# Patient Record
Sex: Male | Born: 1935
Health system: Southern US, Community
[De-identification: ages and names within clinical notes are randomized; demographics above are authoritative.]

## PROBLEM LIST (undated history)

## (undated) DIAGNOSIS — Z789 Other specified health status: Secondary | ICD-10-CM

## (undated) DIAGNOSIS — I10 Essential (primary) hypertension: Secondary | ICD-10-CM

## (undated) HISTORY — PX: OTHER SURGICAL HISTORY: SHX169

## (undated) HISTORY — PX: EYE SURGERY: SHX253

---

## 2017-10-12 ENCOUNTER — Other Ambulatory Visit (INDEPENDENT_AMBULATORY_CARE_PROVIDER_SITE_OTHER): Payer: Self-pay | Admitting: Vascular Surgery

## 2017-10-12 ENCOUNTER — Ambulatory Visit (INDEPENDENT_AMBULATORY_CARE_PROVIDER_SITE_OTHER): Payer: Self-pay | Admitting: Vascular Surgery

## 2017-10-12 ENCOUNTER — Encounter (INDEPENDENT_AMBULATORY_CARE_PROVIDER_SITE_OTHER): Payer: Self-pay

## 2017-10-12 DIAGNOSIS — L97521 Non-pressure chronic ulcer of other part of left foot limited to breakdown of skin: Secondary | ICD-10-CM

## 2017-11-18 ENCOUNTER — Ambulatory Visit (INDEPENDENT_AMBULATORY_CARE_PROVIDER_SITE_OTHER): Payer: Self-pay | Admitting: Vascular Surgery

## 2017-11-18 ENCOUNTER — Encounter (INDEPENDENT_AMBULATORY_CARE_PROVIDER_SITE_OTHER): Payer: Self-pay

## 2020-02-03 DIAGNOSIS — L97511 Non-pressure chronic ulcer of other part of right foot limited to breakdown of skin: Secondary | ICD-10-CM | POA: Diagnosis not present

## 2020-02-03 DIAGNOSIS — B351 Tinea unguium: Secondary | ICD-10-CM | POA: Diagnosis not present

## 2020-02-03 DIAGNOSIS — M79675 Pain in left toe(s): Secondary | ICD-10-CM | POA: Diagnosis not present

## 2020-02-03 DIAGNOSIS — M79674 Pain in right toe(s): Secondary | ICD-10-CM | POA: Diagnosis not present

## 2020-04-13 DIAGNOSIS — L97512 Non-pressure chronic ulcer of other part of right foot with fat layer exposed: Secondary | ICD-10-CM | POA: Diagnosis not present

## 2020-04-13 DIAGNOSIS — L97521 Non-pressure chronic ulcer of other part of left foot limited to breakdown of skin: Secondary | ICD-10-CM | POA: Diagnosis not present

## 2020-05-16 ENCOUNTER — Emergency Department: Payer: Medicare Other

## 2020-05-16 DIAGNOSIS — M25062 Hemarthrosis, left knee: Secondary | ICD-10-CM | POA: Diagnosis present

## 2020-05-16 DIAGNOSIS — R748 Abnormal levels of other serum enzymes: Secondary | ICD-10-CM | POA: Diagnosis not present

## 2020-05-16 DIAGNOSIS — T675XXA Heat exhaustion, unspecified, initial encounter: Secondary | ICD-10-CM | POA: Diagnosis not present

## 2020-05-16 DIAGNOSIS — S0083XA Contusion of other part of head, initial encounter: Secondary | ICD-10-CM | POA: Diagnosis not present

## 2020-05-16 DIAGNOSIS — T671XXA Heat syncope, initial encounter: Secondary | ICD-10-CM | POA: Diagnosis not present

## 2020-05-16 DIAGNOSIS — M6282 Rhabdomyolysis: Secondary | ICD-10-CM | POA: Diagnosis present

## 2020-05-16 DIAGNOSIS — W19XXXA Unspecified fall, initial encounter: Secondary | ICD-10-CM | POA: Diagnosis not present

## 2020-05-16 DIAGNOSIS — R9082 White matter disease, unspecified: Secondary | ICD-10-CM | POA: Diagnosis not present

## 2020-05-16 DIAGNOSIS — I248 Other forms of acute ischemic heart disease: Secondary | ICD-10-CM | POA: Diagnosis present

## 2020-05-16 DIAGNOSIS — R9431 Abnormal electrocardiogram [ECG] [EKG]: Secondary | ICD-10-CM | POA: Diagnosis not present

## 2020-05-16 DIAGNOSIS — Y9301 Activity, walking, marching and hiking: Secondary | ICD-10-CM | POA: Diagnosis present

## 2020-05-16 DIAGNOSIS — S51011A Laceration without foreign body of right elbow, initial encounter: Secondary | ICD-10-CM | POA: Diagnosis not present

## 2020-05-16 DIAGNOSIS — M25461 Effusion, right knee: Secondary | ICD-10-CM | POA: Diagnosis not present

## 2020-05-16 DIAGNOSIS — M170 Bilateral primary osteoarthritis of knee: Secondary | ICD-10-CM | POA: Diagnosis not present

## 2020-05-16 DIAGNOSIS — F17200 Nicotine dependence, unspecified, uncomplicated: Secondary | ICD-10-CM | POA: Diagnosis present

## 2020-05-16 DIAGNOSIS — R778 Other specified abnormalities of plasma proteins: Secondary | ICD-10-CM | POA: Diagnosis not present

## 2020-05-16 DIAGNOSIS — X30XXXA Exposure to excessive natural heat, initial encounter: Secondary | ICD-10-CM

## 2020-05-16 DIAGNOSIS — W1839XA Other fall on same level, initial encounter: Secondary | ICD-10-CM | POA: Diagnosis present

## 2020-05-16 DIAGNOSIS — D72829 Elevated white blood cell count, unspecified: Secondary | ICD-10-CM | POA: Diagnosis present

## 2020-05-16 DIAGNOSIS — S0003XA Contusion of scalp, initial encounter: Secondary | ICD-10-CM | POA: Diagnosis not present

## 2020-05-16 DIAGNOSIS — Z20822 Contact with and (suspected) exposure to covid-19: Secondary | ICD-10-CM | POA: Diagnosis not present

## 2020-05-16 DIAGNOSIS — G600 Hereditary motor and sensory neuropathy: Secondary | ICD-10-CM | POA: Diagnosis present

## 2020-05-16 DIAGNOSIS — Y92007 Garden or yard of unspecified non-institutional (private) residence as the place of occurrence of the external cause: Secondary | ICD-10-CM

## 2020-05-16 DIAGNOSIS — R55 Syncope and collapse: Secondary | ICD-10-CM | POA: Diagnosis not present

## 2020-05-16 DIAGNOSIS — R531 Weakness: Secondary | ICD-10-CM | POA: Diagnosis not present

## 2020-05-16 DIAGNOSIS — T796XXA Traumatic ischemia of muscle, initial encounter: Secondary | ICD-10-CM | POA: Diagnosis not present

## 2020-05-16 DIAGNOSIS — E860 Dehydration: Secondary | ICD-10-CM | POA: Diagnosis not present

## 2020-05-16 DIAGNOSIS — G9389 Other specified disorders of brain: Secondary | ICD-10-CM | POA: Diagnosis not present

## 2020-05-16 DIAGNOSIS — S199XXA Unspecified injury of neck, initial encounter: Secondary | ICD-10-CM | POA: Diagnosis not present

## 2020-05-16 DIAGNOSIS — M25462 Effusion, left knee: Secondary | ICD-10-CM | POA: Diagnosis not present

## 2020-05-16 DIAGNOSIS — Y92009 Unspecified place in unspecified non-institutional (private) residence as the place of occurrence of the external cause: Secondary | ICD-10-CM | POA: Diagnosis not present

## 2020-05-16 DIAGNOSIS — N179 Acute kidney failure, unspecified: Secondary | ICD-10-CM | POA: Diagnosis not present

## 2020-05-16 DIAGNOSIS — Z743 Need for continuous supervision: Secondary | ICD-10-CM | POA: Diagnosis not present

## 2020-05-16 DIAGNOSIS — R404 Transient alteration of awareness: Secondary | ICD-10-CM | POA: Diagnosis not present

## 2020-05-16 DIAGNOSIS — S0990XA Unspecified injury of head, initial encounter: Secondary | ICD-10-CM | POA: Diagnosis not present

## 2020-05-16 LAB — CBC
HCT: 37.8 % — ABNORMAL LOW (ref 39.0–52.0)
Hemoglobin: 12.7 g/dL — ABNORMAL LOW (ref 13.0–17.0)
MCH: 32.6 pg (ref 26.0–34.0)
MCHC: 33.6 g/dL (ref 30.0–36.0)
MCV: 96.9 fL (ref 80.0–100.0)
Platelets: 190 10*3/uL (ref 150–400)
RBC: 3.9 MIL/uL — ABNORMAL LOW (ref 4.22–5.81)
RDW: 14 % (ref 11.5–15.5)
WBC: 15.3 10*3/uL — ABNORMAL HIGH (ref 4.0–10.5)
nRBC: 0 % (ref 0.0–0.2)

## 2020-05-16 LAB — URINALYSIS, COMPLETE (UACMP) WITH MICROSCOPIC
Bacteria, UA: NONE SEEN
Bilirubin Urine: NEGATIVE
Glucose, UA: NEGATIVE mg/dL
Ketones, ur: NEGATIVE mg/dL
Leukocytes,Ua: NEGATIVE
Nitrite: NEGATIVE
Protein, ur: NEGATIVE mg/dL
Specific Gravity, Urine: 1.015 (ref 1.005–1.030)
pH: 5 (ref 5.0–8.0)

## 2020-05-16 LAB — BASIC METABOLIC PANEL
Anion gap: 11 (ref 5–15)
BUN: 26 mg/dL — ABNORMAL HIGH (ref 8–23)
CO2: 21 mmol/L — ABNORMAL LOW (ref 22–32)
Calcium: 9.2 mg/dL (ref 8.9–10.3)
Chloride: 105 mmol/L (ref 98–111)
Creatinine, Ser: 1.44 mg/dL — ABNORMAL HIGH (ref 0.61–1.24)
GFR calc Af Amer: 51 mL/min — ABNORMAL LOW (ref >60)
GFR calc non Af Amer: 44 mL/min — ABNORMAL LOW (ref >60)
Glucose, Bld: 103 mg/dL — ABNORMAL HIGH (ref 70–99)
Potassium: 4.8 mmol/L (ref 3.5–5.1)
Sodium: 137 mmol/L (ref 135–145)

## 2020-05-16 LAB — TROPONIN I (HIGH SENSITIVITY): Troponin I (High Sensitivity): 67 ng/L — ABNORMAL HIGH (ref <18)

## 2020-05-16 NOTE — ED Triage Notes (Signed)
Pt reports he lost control of bilateral legs today while mowing and then again under car port and fell, hitting head on concrete. Pt has bruising and swelling to the right forehead. No bleeding. Pt is A&O x4. LOC when pt fell on concrete. Per witness pt fell asleep and then fell. Skin abrasion to the right forearm .

## 2020-05-16 NOTE — ED Triage Notes (Signed)
First nurse note-pt brought in via ems from home .  Pt fell after mowing the lawn today.  Glucose 113.  bp 118/70per ems.  Pt alert.  No chest pain or sob. Skin tear to right arm.

## 2020-05-17 ENCOUNTER — Emergency Department: Payer: Medicare Other

## 2020-05-17 ENCOUNTER — Other Ambulatory Visit: Payer: Self-pay

## 2020-05-17 ENCOUNTER — Inpatient Hospital Stay
Admission: EM | Admit: 2020-05-17 | Discharge: 2020-05-21 | DRG: 641 | Disposition: A | Discharge status: Skilled Nursing Facility | Payer: Medicare Other | Attending: Internal Medicine | Admitting: Internal Medicine

## 2020-05-17 ENCOUNTER — Observation Stay: Payer: Medicare Other

## 2020-05-17 ENCOUNTER — Encounter: Payer: Self-pay | Admitting: Emergency Medicine

## 2020-05-17 DIAGNOSIS — R55 Syncope and collapse: Secondary | ICD-10-CM | POA: Diagnosis not present

## 2020-05-17 DIAGNOSIS — R531 Weakness: Secondary | ICD-10-CM

## 2020-05-17 DIAGNOSIS — N179 Acute kidney failure, unspecified: Secondary | ICD-10-CM | POA: Diagnosis present

## 2020-05-17 DIAGNOSIS — Y92009 Unspecified place in unspecified non-institutional (private) residence as the place of occurrence of the external cause: Secondary | ICD-10-CM

## 2020-05-17 DIAGNOSIS — D72829 Elevated white blood cell count, unspecified: Secondary | ICD-10-CM | POA: Diagnosis present

## 2020-05-17 DIAGNOSIS — R9431 Abnormal electrocardiogram [ECG] [EKG]: Secondary | ICD-10-CM | POA: Insufficient documentation

## 2020-05-17 DIAGNOSIS — E860 Dehydration: Secondary | ICD-10-CM

## 2020-05-17 DIAGNOSIS — G9389 Other specified disorders of brain: Secondary | ICD-10-CM | POA: Diagnosis not present

## 2020-05-17 DIAGNOSIS — M25462 Effusion, left knee: Secondary | ICD-10-CM

## 2020-05-17 DIAGNOSIS — S51011A Laceration without foreign body of right elbow, initial encounter: Secondary | ICD-10-CM

## 2020-05-17 DIAGNOSIS — M6282 Rhabdomyolysis: Secondary | ICD-10-CM | POA: Diagnosis present

## 2020-05-17 DIAGNOSIS — Z720 Tobacco use: Secondary | ICD-10-CM | POA: Diagnosis present

## 2020-05-17 DIAGNOSIS — R748 Abnormal levels of other serum enzymes: Secondary | ICD-10-CM | POA: Diagnosis present

## 2020-05-17 DIAGNOSIS — R7989 Other specified abnormal findings of blood chemistry: Secondary | ICD-10-CM | POA: Diagnosis present

## 2020-05-17 DIAGNOSIS — R778 Other specified abnormalities of plasma proteins: Secondary | ICD-10-CM | POA: Diagnosis present

## 2020-05-17 DIAGNOSIS — R9082 White matter disease, unspecified: Secondary | ICD-10-CM | POA: Diagnosis not present

## 2020-05-17 DIAGNOSIS — W19XXXA Unspecified fall, initial encounter: Secondary | ICD-10-CM

## 2020-05-17 DIAGNOSIS — S0990XA Unspecified injury of head, initial encounter: Secondary | ICD-10-CM | POA: Diagnosis not present

## 2020-05-17 LAB — BASIC METABOLIC PANEL
Anion gap: 13 (ref 5–15)
BUN: 26 mg/dL — ABNORMAL HIGH (ref 8–23)
CO2: 22 mmol/L (ref 22–32)
Calcium: 9.1 mg/dL (ref 8.9–10.3)
Chloride: 102 mmol/L (ref 98–111)
Creatinine, Ser: 1.3 mg/dL — ABNORMAL HIGH (ref 0.61–1.24)
GFR calc Af Amer: 57 mL/min — ABNORMAL LOW (ref >60)
GFR calc non Af Amer: 49 mL/min — ABNORMAL LOW (ref >60)
Glucose, Bld: 110 mg/dL — ABNORMAL HIGH (ref 70–99)
Potassium: 4.2 mmol/L (ref 3.5–5.1)
Sodium: 137 mmol/L (ref 135–145)

## 2020-05-17 LAB — CK
Total CK: 392 U/L (ref 49–397)
Total CK: 436 U/L — ABNORMAL HIGH (ref 49–397)

## 2020-05-17 LAB — LIPID PANEL
Cholesterol: 192 mg/dL (ref 0–200)
HDL: 35 mg/dL — ABNORMAL LOW (ref >40)
LDL Cholesterol: 121 mg/dL — ABNORMAL HIGH (ref 0–99)
Total CHOL/HDL Ratio: 5.5 RATIO
Triglycerides: 180 mg/dL — ABNORMAL HIGH (ref <150)
VLDL: 36 mg/dL (ref 0–40)

## 2020-05-17 LAB — TROPONIN I (HIGH SENSITIVITY)
Troponin I (High Sensitivity): 190 ng/L (ref <18)
Troponin I (High Sensitivity): 168 ng/L (ref <18)
Troponin I (High Sensitivity): 132 ng/L (ref <18)
Troponin I (High Sensitivity): 92 ng/L — ABNORMAL HIGH (ref <18)

## 2020-05-17 LAB — SARS CORONAVIRUS 2 BY RT PCR (HOSPITAL ORDER, PERFORMED IN ~~LOC~~ HOSPITAL LAB): SARS Coronavirus 2: NEGATIVE

## 2020-05-17 MED ORDER — ASPIRIN 81 MG PO CHEW
81.0000 mg | CHEWABLE_TABLET | Freq: Every day | ORAL | Status: DC
  Administered 2020-05-18 – 2020-05-21 (×4): 81 mg via ORAL
  Filled 2020-05-17 (×4): qty 1

## 2020-05-17 MED ORDER — SODIUM CHLORIDE 0.9 % IV SOLN: INTRAVENOUS | Status: DC

## 2020-05-17 MED ORDER — ACETAMINOPHEN 325 MG PO TABS
650.0000 mg | ORAL_TABLET | Freq: Four times a day (QID) | ORAL | Status: DC | PRN
  Administered 2020-05-20 (×2): 650 mg via ORAL
  Filled 2020-05-17 (×2): qty 2

## 2020-05-17 MED ORDER — ACETAMINOPHEN 500 MG PO TABS
1000.0000 mg | ORAL_TABLET | ORAL | Status: AC
  Administered 2020-05-17: 1000 mg via ORAL
  Filled 2020-05-17: qty 2

## 2020-05-17 MED ORDER — SODIUM CHLORIDE 0.9 % IV BOLUS
1000.0000 mL | Freq: Once | INTRAVENOUS | Status: AC
  Administered 2020-05-17: 1000 mL via INTRAVENOUS

## 2020-05-17 MED ORDER — ASPIRIN 81 MG PO CHEW
324.0000 mg | CHEWABLE_TABLET | Freq: Once | ORAL | Status: AC
  Administered 2020-05-17: 324 mg via ORAL
  Filled 2020-05-17: qty 4

## 2020-05-17 MED ORDER — NICOTINE 21 MG/24HR TD PT24
21.0000 mg | MEDICATED_PATCH | Freq: Every day | TRANSDERMAL | Status: DC
  Administered 2020-05-17 – 2020-05-21 (×5): 21 mg via TRANSDERMAL
  Filled 2020-05-17 (×5): qty 1

## 2020-05-17 MED ORDER — ONDANSETRON HCL 4 MG/2ML IJ SOLN: 4.0000 mg | Freq: Three times a day (TID) | INTRAMUSCULAR | Status: DC | PRN

## 2020-05-17 MED ORDER — MUSCLE RUB 10-15 % EX CREA
TOPICAL_CREAM | CUTANEOUS | Status: DC | PRN
  Filled 2020-05-17 (×2): qty 85

## 2020-05-17 NOTE — ED Notes (Signed)
Pt's right arm cleaned up and non stick gauze and AED pad applied then wrapped with gauze.

## 2020-05-17 NOTE — ED Notes (Signed)
Pt given breakfast tray

## 2020-05-17 NOTE — ED Provider Notes (Signed)
[facility] Emergency Department Provider Note   ____________________________________________   First MD Initiated Contact with Patient 05/17/20 [id number]     (approximate)  I have reviewed the triage vital signs and the nursing notes.   HISTORY  Chief Complaint Weakness and Fall    HPI Keith Morris is a 86 y.o. male history of Charcot-Marie-Tooth  Patient reports that yesterday after mowing the lawn he was walking into his home when he fell in his carport.  He fell onto both knees, and also scraped his right elbow.  Denies that the elbow feels injured other than some skin scrapes.  Both knees are sore, but he reports they chronically hurt as well.  He has a history of Charcot Hilda Lias as well as arthritis  No neck pain.  No fevers or chills or recent illness.  No chest pain or trouble breathing.  Denies abdominal pain.  Did report he felt fatigued, felt like his legs sort of got weak as he was walking and buckled underneath him  Currently reports aching in both knees.  No new numbness or weakness, but does have some slight muscle weakness due to his Charcot Hilda Lias   Has not had anything to eat since 1230 yesterday.  Nothing to drink since then.  Thought he might be dehydrated because of the heat when he fell  Reports the only medication he takes is he takes a baby aspirin about once every couple of days  No past medical history on file.  Patient Active Problem List   Diagnosis Date Noted   Fall at home, initial encounter 05/17/2020   AKI (acute kidney injury) (HCC) 05/17/2020   Elevated troponin 05/17/2020   Elevated CK 05/17/2020   Leukocytosis 05/17/2020   Abnormal EKG       Prior to Admission medications   Not on File    Allergies Patient has no known allergies.  No family history on file.  Social History Social History                   Patient does not use drugs, does not use alcohol.  Patient does smoke cigarettes  Review of  Systems Constitutional: No fever/chills Eyes: No visual changes. ENT: No sore throat.  No neck pain Cardiovascular: Denies chest pain. Respiratory: Denies shortness of breath. Gastrointestinal: No abdominal pain.   Genitourinary: Negative for dysuria. Musculoskeletal: Negative for back pain.  Some abrasion over his right elbow, chronic pain and achiness in both knees slightly more sore after falling.  Would like something like BenGay and usually also takes aspirin for pain relief at home Skin: Negative for rash. Neurological: Negative for headaches, areas of focal weakness or numbness.    ____________________________________________   PHYSICAL EXAM:  VITAL SIGNS: ED Triage Vitals  Enc Vitals Group     BP 05/16/20 2019 (!) 145/99     Pulse Rate 05/16/20 2019 81     Resp 05/16/20 2019 18     Temp 05/16/20 2019 98.4 F (36.9 C)     Temp Source 05/16/20 2019 Oral     SpO2 05/16/20 2019 98 %     Weight 05/17/20 0504 168 lb (76.2 kg)     Height 05/17/20 0504 6' (1.829 m)     Head Circumference --      Peak Flow --      Pain Score 05/17/20 0700 5     Pain Loc --      Pain Edu? --  Excl. in GC? --     Constitutional: Alert and oriented. Well appearing and in no acute distress.  He appears mildly fatigued. Eyes: Conjunctivae are normal. Head: Atraumatic. Nose: No congestion/rhinnorhea. Mouth/Throat: Mucous membranes are very dry. Neck: No stridor.  Cardiovascular: Normal rate, regular rhythm. Grossly normal heart sounds.  Good peripheral circulation. Respiratory: Normal respiratory effort.  No retractions. Lungs CTAB. Gastrointestinal: Soft and nontender. No distention. Musculoskeletal: No lower extremity tenderness nor edema.  Arthritic-like changes over both knees, slight weakness in the lower extremities bilateral.  Skin tear noted over the right elbow.  Ranges it well, and does not wish for x-ray.  No focal tenderness in that area to suggest fracture. Neurologic:   Normal speech and language. No gross focal neurologic deficits are appreciated.  Skin:  Skin is warm, dry and intact. No rash noted. Psychiatric: Mood and affect are normal. Speech and behavior are normal.  Patient is extremely pleasant ____________________________________________   LABS (all labs ordered are listed, but only abnormal results are displayed)  Labs Reviewed  BASIC METABOLIC PANEL - Abnormal; Notable for the following components:      Result Value   CO2 21 (*)    Glucose, Bld 103 (*)    BUN 26 (*)    Creatinine, Ser 1.44 (*)    GFR calc non Af Amer 44 (*)    GFR calc Af Amer 51 (*)    All other components within normal limits  CBC - Abnormal; Notable for the following components:   WBC 15.3 (*)    RBC 3.90 (*)    Hemoglobin 12.7 (*)    HCT 37.8 (*)    All other components within normal limits  URINALYSIS, COMPLETE (UACMP) WITH MICROSCOPIC - Abnormal; Notable for the following components:   Color, Urine YELLOW (*)    APPearance CLEAR (*)    Hgb urine dipstick SMALL (*)    All other components within normal limits  TROPONIN I (HIGH SENSITIVITY) - Abnormal; Notable for the following components:   Troponin I (High Sensitivity) 67 (*)    All other components within normal limits  TROPONIN I (HIGH SENSITIVITY) - Abnormal; Notable for the following components:   Troponin I (High Sensitivity) 190 (*)    All other components within normal limits  SARS CORONAVIRUS 2 BY RT PCR (HOSPITAL ORDER, PERFORMED IN  HOSPITAL LAB)  CK  BASIC METABOLIC PANEL  CK  LIPID PANEL  TROPONIN I (HIGH SENSITIVITY)   ____________________________________________  EKG  Reviewed interpreted by me at 7 AM Heart rate 80 QRS 90 QTc 460 Normal sinus rhythm, mild ST abnormality noted in inferior and lateral also slight anterior.  Of note patient denies having any chest pain. ____________________________________________  RADIOLOGY  CT Head Wo Contrast  Result Date:  05/16/2020 CLINICAL DATA:  Head injury, fall, facial trauma EXAM: CT HEAD WITHOUT CONTRAST CT CERVICAL SPINE WITHOUT CONTRAST TECHNIQUE: Multidetector CT imaging of the head and cervical spine was performed following the standard protocol without intravenous contrast. Multiplanar CT image reconstructions of the cervical spine were also generated. COMPARISON:  None. FINDINGS: CT HEAD FINDINGS Brain: Normal anatomic configuration. Mild to moderate parenchymal volume loss is commensurate with the patient's age. Mild periventricular white matter changes are present likely reflecting the sequela of small vessel ischemia. No abnormal intra or extra-axial mass lesion or fluid collection. No abnormal mass effect or midline shift. No evidence of acute intracranial hemorrhage or infarct. Ventricular size is normal. Cerebellum unremarkable. Vascular: Moderate atherosclerotic calcification is  seen within the carotid siphons bilaterally. No asymmetric hyperdense vasculature noted at the skull base. Skull: Intact Sinuses/Orbits: Paranasal sinuses are clear. Orbits are unremarkable. Other: Mastoid air cells and middle ear cavities are clear. Small scalp hematoma seen within the right supraorbital region. CT CERVICAL SPINE FINDINGS Alignment: Normal cervical lordosis. No listhesis of the cervical spine. Skull base and vertebrae: The craniocervical junction is unremarkable. The atlantodental interval is normal. Vertebral body height has been preserved. No acute fracture of the cervical spine. No lytic or blastic bone lesion. Soft tissues and spinal canal: Moderate atherosclerotic calcifications seen within the carotid bifurcations bilaterally. No prevertebral soft tissue swelling or paraspinal fluid collections identified. No visible canal hematoma. Disc levels: There is intervertebral disc space narrowing and endplate remodeling of C3-T2 in keeping with changes of diffuse moderate to severe degenerative disc disease. There is  degenerative ankylosis of the vertebral bodies of C4 and C5. Review of the axial images demonstrates: C1-2: Moderate atlantodental degenerative arthritis. C2-3: Severe left facet arthrosis results in moderate left neural foraminal narrowing. No significant canal stenosis. C3-4: Severe bilateral uncovertebral arthrosis, mild left facet arthrosis, and severe right facet arthrosis results in severe right neural foraminal narrowing and moderate left neural foraminal narrowing. Mild effacement of the anterior canal space. No significant canal stenosis. C4-5: Moderate bilateral uncovertebral arthrosis and mild right and moderate left facet arthrosis results in severe left and moderate to severe right neural foraminal narrowing. Effacement of the a anterior canal space without significant canal stenosis. C5-6: Mild osteophytic ridging and mild bilateral facet arthrosis results in mild left and mild-to-moderate right neural foraminal narrowing. No significant canal stenosis. C6-7: Moderate bilateral uncovertebral arthrosis and mild facet arthrosis results in moderate bilateral neural foraminal narrowing. C7-T1: Moderate bilateral facet arthrosis. No significant neural foraminal narrowing. No canal stenosis. Upper chest: The visualized lung apices are unremarkable. Other: None significant IMPRESSION: 1. Small right supraorbital scalp hematoma. 2. No acute intracranial abnormality. 3. No acute fracture or subluxation of the cervical spine. 4. Moderate to severe multilevel degenerative disc disease and facet arthrosis of the cervical spine, most prominent at C3-T2. Electronically Signed   By: Helyn Numbers MD   On: 05/16/2020 21:19   CT Cervical Spine Wo Contrast  Result Date: 05/16/2020 CLINICAL DATA:  Head injury, fall, facial trauma EXAM: CT HEAD WITHOUT CONTRAST CT CERVICAL SPINE WITHOUT CONTRAST TECHNIQUE: Multidetector CT imaging of the head and cervical spine was performed following the standard protocol without  intravenous contrast. Multiplanar CT image reconstructions of the cervical spine were also generated. COMPARISON:  None. FINDINGS: CT HEAD FINDINGS Brain: Normal anatomic configuration. Mild to moderate parenchymal volume loss is commensurate with the patient's age. Mild periventricular white matter changes are present likely reflecting the sequela of small vessel ischemia. No abnormal intra or extra-axial mass lesion or fluid collection. No abnormal mass effect or midline shift. No evidence of acute intracranial hemorrhage or infarct. Ventricular size is normal. Cerebellum unremarkable. Vascular: Moderate atherosclerotic calcification is seen within the carotid siphons bilaterally. No asymmetric hyperdense vasculature noted at the skull base. Skull: Intact Sinuses/Orbits: Paranasal sinuses are clear. Orbits are unremarkable. Other: Mastoid air cells and middle ear cavities are clear. Small scalp hematoma seen within the right supraorbital region. CT CERVICAL SPINE FINDINGS Alignment: Normal cervical lordosis. No listhesis of the cervical spine. Skull base and vertebrae: The craniocervical junction is unremarkable. The atlantodental interval is normal. Vertebral body height has been preserved. No acute fracture of the cervical spine. No lytic or blastic bone  lesion. Soft tissues and spinal canal: Moderate atherosclerotic calcifications seen within the carotid bifurcations bilaterally. No prevertebral soft tissue swelling or paraspinal fluid collections identified. No visible canal hematoma. Disc levels: There is intervertebral disc space narrowing and endplate remodeling of C3-T2 in keeping with changes of diffuse moderate to severe degenerative disc disease. There is degenerative ankylosis of the vertebral bodies of C4 and C5. Review of the axial images demonstrates: C1-2: Moderate atlantodental degenerative arthritis. C2-3: Severe left facet arthrosis results in moderate left neural foraminal narrowing. No  significant canal stenosis. C3-4: Severe bilateral uncovertebral arthrosis, mild left facet arthrosis, and severe right facet arthrosis results in severe right neural foraminal narrowing and moderate left neural foraminal narrowing. Mild effacement of the anterior canal space. No significant canal stenosis. C4-5: Moderate bilateral uncovertebral arthrosis and mild right and moderate left facet arthrosis results in severe left and moderate to severe right neural foraminal narrowing. Effacement of the a anterior canal space without significant canal stenosis. C5-6: Mild osteophytic ridging and mild bilateral facet arthrosis results in mild left and mild-to-moderate right neural foraminal narrowing. No significant canal stenosis. C6-7: Moderate bilateral uncovertebral arthrosis and mild facet arthrosis results in moderate bilateral neural foraminal narrowing. C7-T1: Moderate bilateral facet arthrosis. No significant neural foraminal narrowing. No canal stenosis. Upper chest: The visualized lung apices are unremarkable. Other: None significant IMPRESSION: 1. Small right supraorbital scalp hematoma. 2. No acute intracranial abnormality. 3. No acute fracture or subluxation of the cervical spine. 4. Moderate to severe multilevel degenerative disc disease and facet arthrosis of the cervical spine, most prominent at C3-T2. Electronically Signed   By: Helyn Numbers MD   On: 05/16/2020 21:19   DG Knee Complete 4 Views Left  Result Date: 05/17/2020 CLINICAL DATA:  Fall yesterday. EXAM: LEFT KNEE - COMPLETE 4+ VIEW COMPARISON:  Right knee radiographs 05/17/2020 FINDINGS: Advanced tricompartmental degenerative changes are noted in the knee. A moderate to large effusion is present. No discrete fracture is evident. Vascular calcifications are noted. IMPRESSION: 1. Moderate to large effusion. 2. No discrete fracture.  Ligamentous injury is not excluded. 3. Advanced tricompartmental degenerative changes. Electronically Signed    By: Marin Roberts M.D.   On: 05/17/2020 06:44   DG Knee Complete 4 Views Right  Result Date: 05/17/2020 CLINICAL DATA:  Bilateral knee pain.  Fall yesterday. EXAM: RIGHT KNEE - COMPLETE 4+ VIEW COMPARISON:  Left knee radiographs 05/17/2020 FINDINGS: Advanced degenerative changes are present with asymmetric joint space loss laterally. Patellofemoral disease is present. Small effusion is noted. No acute or healing fractures are present. Vascular calcifications are present. IMPRESSION: 1. Small effusion. 2. No acute fractures. 3. Advanced tricompartmental degenerative changes. 4. Atherosclerosis. Electronically Signed   By: Marin Roberts M.D.   On: 05/17/2020 06:41     Imaging studies are reviewed, notable for effusions of the knees bilateral but no acute fractures.  Head CT notable for small right scalp hematoma ____________________________________________   PROCEDURES  Procedure(s) performed: None  Procedures  Critical Care performed: No  ____________________________________________   INITIAL IMPRESSION / ASSESSMENT AND PLAN / ED COURSE  Pertinent labs & imaging results that were available during my care of the patient were reviewed by me and considered in my medical decision making (see chart for details).   Patient presents after fall after mowing the lawn.  Felt weak or dehydrated.  Suspect this likely contributed causing him to feel weak in the knees and fall.  He does however have a slightly abnormal EKG as well as  mildly elevated troponins this morning.  By time I saw him he has been in the waiting area n.p.o. for about 12 hours and appears extremely dry.  Denies history of cardiac disease.  Additionally has mildly elevated CK elevated creatinine, unknown baseline but appears that he does not have any known history of kidney disease.  Leukocytosis.  Denies infectious symptoms  Discussed with the patient, will hydrate him, allow him to eat and drink, provide pain relief  for which he would like BenGay cream as well as aspirin which is his typical treatments.  Both knees appear to have arthritic-like changes and effusions but they do not appear to be acute on my examination, he has severe arthropathy by exam in both knees  Small skin tear to moderate skin tear over the right elbow and forearm bandaged.  We will admit to hospital for further care and management.  Keith Morris was evaluated in Emergency Department on 05/17/2020 for the symptoms described in the history of present illness. He was evaluated in the context of the global COVID-19 pandemic, which necessitated consideration that the patient might be at risk for infection with the SARS-CoV-2 virus that causes COVID-19. Institutional protocols and algorithms that pertain to the evaluation of patients at risk for COVID-19 are in a state of rapid change based on information released by regulatory bodies including the CDC and federal and state organizations. These policies and algorithms were followed during the patient's care in the ED.     Admission discussed with Dr. Clyde LundborgNiu  ____________________________________________   FINAL CLINICAL IMPRESSION(S) / ED DIAGNOSES  Final diagnoses:  Weakness  Abnormal EKG  Elevated troponin  AKI (acute kidney injury) (HCC)  Dehydration  Skin tear of right elbow without complication, initial encounter        Note:  This document was prepared using Dragon voice recognition software and may include unintentional dictation errors       Sharyn CreamerQuale, Arlynn Mcdermid, MD 05/17/20 [phone]

## 2020-05-17 NOTE — ED Notes (Signed)
Pt given coffee at this time 

## 2020-05-17 NOTE — H&P (Signed)
History and Physical    Keith Morris SWF:[id number] DOB: 1932-07-31 DOA: 05/17/2020  Referring MD/NP/PA:   PCP: Patient, No Pcp Per   Patient coming from:  The patient is coming from home.  At baseline, pt is independent for most of ADL.        Chief Complaint: syncope  HPI: Keith Morris is a 86 y.o. male with medical history significant of tobacco abuse, who presents with syncope.  Per her daughter, patient had 2 episodes of syncope.  First episode happened while patient was mowing the lawn at about 2:30 PM yesterday. Pt had another episode of syncope when patient moved to inside of the house.  Patient states that he lost control of both legs.  No seizure activity.  Patient fell and hit his head.  Patient has a skin tear in the right forearm.  He also has hematoma in the right forehead.  Patient does not have unilateral numbness or tingling in extremities. No facial droop or slurred speech.  Denies chest pain, shortness breath, cough, fever or chills.  No nausea vomiting, diarrhea, abdominal pain, symptoms of UTI.   ED Course: pt was found to have WBC 15.3, troponin level 67, 190, 168, 132, negative urinalysis, CK 392, 436, negative Covid PCR, AKI with creatinine 1.44, BUN 26, temperature normal, blood pressure 129/68, heart rate 76, RR 14, oxygen saturation 98% on room air.  Patient is placed on progressive bed for observation.  CT of head and C spin: 1. Small right supraorbital scalp hematoma. 2. No acute intracranial abnormality. 3. No acute fracture or subluxation of the cervical spine. 4. Moderate to severe multilevel degenerative disc disease and facet arthrosis of the cervical spine, most prominent at C3-T2.  X-ray of L knee: 1. Moderate to large effusion. 2. No discrete fracture.  Ligamentous injury is not excluded. 3. Advanced tricompartmental degenerative changes.  X-ray of right knee: 1. Small effusion. 2. No acute fractures. 3. Advanced tricompartmental degenerative  changes. 4. Atherosclerosis.   Review of Systems:   General: no fevers, chills, no body weight gain, has fatigue HEENT: no blurry vision, hearing changes or sore throat Respiratory: no dyspnea, coughing, wheezing CV: no chest pain, no palpitations GI: no nausea, vomiting, abdominal pain, diarrhea, constipation GU: no dysuria, burning on urination, increased urinary frequency, hematuria  Ext: no leg edema Neuro: no unilateral weakness, numbness, or tingling, no vision change or hearing loss. Has fall and syncope Skin: no rash, has skin tear in right arm MSK: No muscle spasm, no deformity, no limitation of range of movement in spin Heme: No easy bruising.  Travel history: No recent long distant travel.  Allergy: No Known Allergies  History reviewed. No pertinent past medical history.  Past Surgical History:  Procedure Laterality Date  . foot surgery bilaterally      Social History:  reports that he has been smoking. He has never used smokeless tobacco. He reports previous alcohol use. He reports that he does not use drugs.  Family History: Reviewed with patient and his daughter, no significant family medical history.   Prior to Admission medications   Not on File    Physical Exam: Vitals:   05/17/20 0820 05/17/20 1034 05/17/20 1247 05/17/20 1715  BP: 129/65 (!) 108/58  (!) 160/87  Pulse: 73 73 73 85  Resp: 15 18 19 16   Temp:    98.3 F (36.8 C)  TempSrc:    Oral  SpO2: 100% 100% 97% 100%  Weight:    73.2 kg  Height:    6' (1.829 m)   General: Not in acute distress HEENT: has hematoma in right forehead.       Eyes: PERRL, EOMI, no scleral icterus.       ENT: No discharge from the ears and nose, no pharynx injection, no tonsillar enlargement.        Neck: No JVD, no bruit, no mass felt. Heme: No neck lymph node enlargement. Cardiac: S1/S2, RRR, No murmurs, No gallops or rubs. Respiratory:  No rales, wheezing, rhonchi or rubs. GI: Soft, nondistended, nontender, no  rebound pain, no organomegaly, BS present. GU: No hematuria Ext: No pitting leg edema bilaterally. 2+DP/PT pulse bilaterally. Musculoskeletal: No joint deformities, No joint redness or warmth, no limitation of ROM in spin. Skin: No rashes. Has skin tear in right forearm. Neuro: Alert, oriented X3, cranial nerves II-XII grossly intact, moves all extremities. Psych: Patient is not psychotic, no suicidal or hemocidal ideation.  Labs on Admission: I have personally reviewed following labs and imaging studies  CBC: Recent Labs  Lab 05/16/20 2020  WBC 15.3*  HGB 12.7*  HCT 37.8*  MCV 96.9  PLT 190   Basic Metabolic Panel: Recent Labs  Lab 05/16/20 2020 05/17/20 0810  NA 137 137  K 4.8 4.2  CL 105 102  CO2 21* 22  GLUCOSE 103* 110*  BUN 26* 26*  CREATININE 1.44* 1.30*  CALCIUM 9.2 9.1   GFR: Estimated Creatinine Clearance: 42.2 mL/min (A) (by C-G formula based on SCr of 1.3 mg/dL (H)). Liver Function Tests: No results for input(s): AST, ALT, ALKPHOS, BILITOT, PROT, ALBUMIN in the last 168 hours. No results for input(s): LIPASE, AMYLASE in the last 168 hours. No results for input(s): AMMONIA in the last 168 hours. Coagulation Profile: No results for input(s): INR, PROTIME in the last 168 hours. Cardiac Enzymes: Recent Labs  Lab 05/17/20 0505 05/17/20 0810  CKTOTAL 392 436*   BNP (last 3 results) No results for input(s): PROBNP in the last 8760 hours. HbA1C: No results for input(s): HGBA1C in the last 72 hours. CBG: No results for input(s): GLUCAP in the last 168 hours. Lipid Profile: Recent Labs    05/17/20 0810  CHOL 192  HDL 35*  LDLCALC 121*  TRIG 180*  CHOLHDL 5.5   Thyroid Function Tests: No results for input(s): TSH, T4TOTAL, FREET4, T3FREE, THYROIDAB in the last 72 hours. Anemia Panel: No results for input(s): VITAMINB12, FOLATE, FERRITIN, TIBC, IRON, RETICCTPCT in the last 72 hours. Urine analysis:    Component Value Date/Time   COLORURINE  YELLOW (A) 05/16/2020 2015   APPEARANCEUR CLEAR (A) 05/16/2020 2015   LABSPEC 1.015 05/16/2020 2015   PHURINE 5.0 05/16/2020 2015   GLUCOSEU NEGATIVE 05/16/2020 2015   HGBUR SMALL (A) 05/16/2020 2015   BILIRUBINUR NEGATIVE 05/16/2020 2015   KETONESUR NEGATIVE 05/16/2020 2015   PROTEINUR NEGATIVE 05/16/2020 2015   NITRITE NEGATIVE 05/16/2020 2015   LEUKOCYTESUR NEGATIVE 05/16/2020 2015   Sepsis Labs: @LABRCNTIP (procalcitonin:4,lacticidven:4) ) Recent Results (from the past 240 hour(s))  SARS Coronavirus 2 by RT PCR (hospital order, performed in [facility] hospital lab) Nasopharyngeal Nasopharyngeal Swab     Status: None   Collection Time: 05/17/20  8:10 AM   Specimen: Nasopharyngeal Swab  Result Value Ref Range Status   SARS Coronavirus 2 NEGATIVE NEGATIVE Final    Comment: (NOTE) SARS-CoV-2 target nucleic acids are NOT DETECTED.  The SARS-CoV-2 RNA is generally detectable in upper and lower respiratory specimens during the acute phase of infection. The lowest concentration  of SARS-CoV-2 viral copies this assay can detect is 250 copies / mL. A negative result does not preclude SARS-CoV-2 infection and should not be used as the sole basis for treatment or other patient management decisions.  A negative result may occur with improper specimen collection / handling, submission of specimen other than nasopharyngeal swab, presence of viral mutation(s) within the areas targeted by this assay, and inadequate number of viral copies (<250 copies / mL). A negative result must be combined with clinical observations, patient history, and epidemiological information.  Fact Sheet for Patients:   BoilerBrush.com.cy  Fact Sheet for Healthcare Providers: https://pope.com/  This test is not yet approved or  cleared by the [place] FDA and has been authorized for detection and/or diagnosis of SARS-CoV-2 by FDA under an Emergency Use  Authorization (EUA).  This EUA will remain in effect (meaning this test can be used) for the duration of the COVID-19 declaration under Section 564(b)(1) of the Act, 21 U.S.C. section 360bbb-3(b)(1), unless the authorization is terminated or revoked sooner.  Performed at [facility] [address] [place], [place] [address]      Radiological Exams on Admission: CT Head Wo Contrast  Result Date: 05/16/2020 CLINICAL DATA:  Head injury, fall, facial trauma EXAM: CT HEAD WITHOUT CONTRAST CT CERVICAL SPINE WITHOUT CONTRAST TECHNIQUE: Multidetector CT imaging of the head and cervical spine was performed following the standard protocol without intravenous contrast. Multiplanar CT image reconstructions of the cervical spine were also generated. COMPARISON:  None. FINDINGS: CT HEAD FINDINGS Brain: Normal anatomic configuration. Mild to moderate parenchymal volume loss is commensurate with the patient's age. Mild periventricular white matter changes are present likely reflecting the sequela of small vessel ischemia. No abnormal intra or extra-axial mass lesion or fluid collection. No abnormal mass effect or midline shift. No evidence of acute intracranial hemorrhage or infarct. Ventricular size is normal. Cerebellum unremarkable. Vascular: Moderate atherosclerotic calcification is seen within the carotid siphons bilaterally. No asymmetric hyperdense vasculature noted at the skull base. Skull: Intact Sinuses/Orbits: Paranasal sinuses are clear. Orbits are unremarkable. Other: Mastoid air cells and middle ear cavities are clear. Small scalp hematoma seen within the right supraorbital region. CT CERVICAL SPINE FINDINGS Alignment: Normal cervical lordosis. No listhesis of the cervical spine. Skull base and vertebrae: The craniocervical junction is unremarkable. The atlantodental interval is normal. Vertebral body height has been preserved. No acute fracture of the cervical spine. No lytic or blastic  bone lesion. Soft tissues and spinal canal: Moderate atherosclerotic calcifications seen within the carotid bifurcations bilaterally. No prevertebral soft tissue swelling or paraspinal fluid collections identified. No visible canal hematoma. Disc levels: There is intervertebral disc space narrowing and endplate remodeling of C3-T2 in keeping with changes of diffuse moderate to severe degenerative disc disease. There is degenerative ankylosis of the vertebral bodies of C4 and C5. Review of the axial images demonstrates: C1-2: Moderate atlantodental degenerative arthritis. C2-3: Severe left facet arthrosis results in moderate left neural foraminal narrowing. No significant canal stenosis. C3-4: Severe bilateral uncovertebral arthrosis, mild left facet arthrosis, and severe right facet arthrosis results in severe right neural foraminal narrowing and moderate left neural foraminal narrowing. Mild effacement of the anterior canal space. No significant canal stenosis. C4-5: Moderate bilateral uncovertebral arthrosis and mild right and moderate left facet arthrosis results in severe left and moderate to severe right neural foraminal narrowing. Effacement of the a anterior canal space without significant canal stenosis. C5-6: Mild osteophytic ridging and mild bilateral facet arthrosis results in mild  left and mild-to-moderate right neural foraminal narrowing. No significant canal stenosis. C6-7: Moderate bilateral uncovertebral arthrosis and mild facet arthrosis results in moderate bilateral neural foraminal narrowing. C7-T1: Moderate bilateral facet arthrosis. No significant neural foraminal narrowing. No canal stenosis. Upper chest: The visualized lung apices are unremarkable. Other: None significant IMPRESSION: 1. Small right supraorbital scalp hematoma. 2. No acute intracranial abnormality. 3. No acute fracture or subluxation of the cervical spine. 4. Moderate to severe multilevel degenerative disc disease and facet  arthrosis of the cervical spine, most prominent at C3-T2. Electronically Signed   By: Helyn NumbersAshesh  Parikh MD   On: 05/16/2020 21:19   CT Cervical Spine Wo Contrast  Result Date: 05/16/2020 CLINICAL DATA:  Head injury, fall, facial trauma EXAM: CT HEAD WITHOUT CONTRAST CT CERVICAL SPINE WITHOUT CONTRAST TECHNIQUE: Multidetector CT imaging of the head and cervical spine was performed following the standard protocol without intravenous contrast. Multiplanar CT image reconstructions of the cervical spine were also generated. COMPARISON:  None. FINDINGS: CT HEAD FINDINGS Brain: Normal anatomic configuration. Mild to moderate parenchymal volume loss is commensurate with the patient's age. Mild periventricular white matter changes are present likely reflecting the sequela of small vessel ischemia. No abnormal intra or extra-axial mass lesion or fluid collection. No abnormal mass effect or midline shift. No evidence of acute intracranial hemorrhage or infarct. Ventricular size is normal. Cerebellum unremarkable. Vascular: Moderate atherosclerotic calcification is seen within the carotid siphons bilaterally. No asymmetric hyperdense vasculature noted at the skull base. Skull: Intact Sinuses/Orbits: Paranasal sinuses are clear. Orbits are unremarkable. Other: Mastoid air cells and middle ear cavities are clear. Small scalp hematoma seen within the right supraorbital region. CT CERVICAL SPINE FINDINGS Alignment: Normal cervical lordosis. No listhesis of the cervical spine. Skull base and vertebrae: The craniocervical junction is unremarkable. The atlantodental interval is normal. Vertebral body height has been preserved. No acute fracture of the cervical spine. No lytic or blastic bone lesion. Soft tissues and spinal canal: Moderate atherosclerotic calcifications seen within the carotid bifurcations bilaterally. No prevertebral soft tissue swelling or paraspinal fluid collections identified. No visible canal hematoma. Disc  levels: There is intervertebral disc space narrowing and endplate remodeling of C3-T2 in keeping with changes of diffuse moderate to severe degenerative disc disease. There is degenerative ankylosis of the vertebral bodies of C4 and C5. Review of the axial images demonstrates: C1-2: Moderate atlantodental degenerative arthritis. C2-3: Severe left facet arthrosis results in moderate left neural foraminal narrowing. No significant canal stenosis. C3-4: Severe bilateral uncovertebral arthrosis, mild left facet arthrosis, and severe right facet arthrosis results in severe right neural foraminal narrowing and moderate left neural foraminal narrowing. Mild effacement of the anterior canal space. No significant canal stenosis. C4-5: Moderate bilateral uncovertebral arthrosis and mild right and moderate left facet arthrosis results in severe left and moderate to severe right neural foraminal narrowing. Effacement of the a anterior canal space without significant canal stenosis. C5-6: Mild osteophytic ridging and mild bilateral facet arthrosis results in mild left and mild-to-moderate right neural foraminal narrowing. No significant canal stenosis. C6-7: Moderate bilateral uncovertebral arthrosis and mild facet arthrosis results in moderate bilateral neural foraminal narrowing. C7-T1: Moderate bilateral facet arthrosis. No significant neural foraminal narrowing. No canal stenosis. Upper chest: The visualized lung apices are unremarkable. Other: None significant IMPRESSION: 1. Small right supraorbital scalp hematoma. 2. No acute intracranial abnormality. 3. No acute fracture or subluxation of the cervical spine. 4. Moderate to severe multilevel degenerative disc disease and facet arthrosis of the cervical spine, most prominent  at C3-T2. Electronically Signed   By: Helyn Numbers MD   On: 05/16/2020 21:19   MR BRAIN WO CONTRAST  Result Date: 05/17/2020 CLINICAL DATA:  Syncope, head injury EXAM: MRI HEAD WITHOUT CONTRAST  TECHNIQUE: Multiplanar, multiecho pulse sequences of the brain and surrounding structures were obtained without intravenous contrast. COMPARISON:  Correlation made with 05/16/2020 head CT FINDINGS: Brain: There is no acute infarction or intracranial hemorrhage. There is no intracranial mass, mass effect, or edema. There is no hydrocephalus or extra-axial fluid collection. Prominence of the ventricles and sulci reflects moderate generalized parenchymal volume loss. Patchy and confluent areas of T2 hyperintensity in the supratentorial white matter are nonspecific but may reflect moderate chronic microvascular ischemic changes. Vascular: Major vessel flow voids at the skull base are preserved. Skull and upper cervical spine: Normal marrow signal is preserved. Sinuses/Orbits: Paranasal sinuses are aerated. Orbits are unremarkable. Other: Sella is unremarkable.  Mastoid air cells are clear. IMPRESSION: No evidence of recent infarction, hemorrhage, or mass. Moderate chronic microvascular ischemic changes. Electronically Signed   By: Guadlupe Spanish M.D.   On: 05/17/2020 14:53   DG Knee Complete 4 Views Left  Result Date: 05/17/2020 CLINICAL DATA:  Fall yesterday. EXAM: LEFT KNEE - COMPLETE 4+ VIEW COMPARISON:  Right knee radiographs 05/17/2020 FINDINGS: Advanced tricompartmental degenerative changes are noted in the knee. A moderate to large effusion is present. No discrete fracture is evident. Vascular calcifications are noted. IMPRESSION: 1. Moderate to large effusion. 2. No discrete fracture.  Ligamentous injury is not excluded. 3. Advanced tricompartmental degenerative changes. Electronically Signed   By: Marin Roberts M.D.   On: 05/17/2020 06:44   DG Knee Complete 4 Views Right  Result Date: 05/17/2020 CLINICAL DATA:  Bilateral knee pain.  Fall yesterday. EXAM: RIGHT KNEE - COMPLETE 4+ VIEW COMPARISON:  Left knee radiographs 05/17/2020 FINDINGS: Advanced degenerative changes are present with asymmetric  joint space loss laterally. Patellofemoral disease is present. Small effusion is noted. No acute or healing fractures are present. Vascular calcifications are present. IMPRESSION: 1. Small effusion. 2. No acute fractures. 3. Advanced tricompartmental degenerative changes. 4. Atherosclerosis. Electronically Signed   By: Marin Roberts M.D.   On: 05/17/2020 06:41     EKG: Independently reviewed.  Sinus rhythm, QTC 460, LAD, early R wave progression, nonspecific T wave change  Assessment/Plan Principal Problem:   Syncope Active Problems:   Fall at home, initial encounter   AKI (acute kidney injury) (HCC)   Elevated troponin   Elevated CK   Leukocytosis   Tobacco abuse   Syncope and fall: Etiology is not clear. The differential diagnosis is broad, including vasovagal syncope, TIA, arrhythmia, orthostatic status.   - Place on aggressive bed for obs -Cardiac monitor - Orthostatic vital signs  - MRI-brain --> negative for stroke - 2d echo - Neuro checks  - start ASA - IVF:1L of NS,  Then 75 cc/h - PT/OT eval and treat -Wound care consult for right forearm skin tear  AKI (acute kidney injury) (HCC): Creatinine 1.44, BUN 26, likely due to dehydration and mild rhabdomyolysis -IV fluid as above -Follow-up with BMP  Elevated troponin: trop 67 -->190 -->168.  Already trending down.  No chest pain.  Likely due to demand ischemia. - Trend Trop - Repeat EKG in the am  - aspirin - Risk factor stratification: will check FLP and A1C   Elevated CK and mild rhabdomyolysis: CK 392, 436 -IV fluid as above -Repeat CK in morning  Leukocytosis: WBC 15.3.  No source  of infection identified.  No fever.  Likely reactive. -Follow-up with CBC  Tobacco abuse -Nicotine patch   DVT ppx: SCD Code Status: Full code per her daughter Family Communication:  Yes, patient's  daughter  at bed side Disposition Plan:  Anticipate discharge back to previous environment Consults called:   None Admission status:  progressive unit for obs    Status is: Observation  The patient remains OBS appropriate and will d/c before 2 midnights.  Dispo: The patient is from: Home              Anticipated d/c is to: Home              Anticipated d/c date is: 1 day              Patient currently is not medically stable to d/c.           Date of Service 05/17/2020    Lorretta Harp [facility]   If 7PM-7AM, please contact night-coverage [email or web address] 05/17/2020, 5:30 PM

## 2020-05-17 NOTE — ED Notes (Signed)
Pt eating lunch tray  

## 2020-05-17 NOTE — ED Notes (Signed)
Daughter at bedside.

## 2020-05-18 ENCOUNTER — Observation Stay: Payer: Medicare Other

## 2020-05-18 ENCOUNTER — Observation Stay (HOSPITAL_COMMUNITY)
Admit: 2020-05-18 | Discharge: 2020-05-18 | Disposition: A | Discharge status: Home / Self Care | Payer: Medicare Other | Attending: Internal Medicine | Admitting: Internal Medicine

## 2020-05-18 DIAGNOSIS — W1839XA Other fall on same level, initial encounter: Secondary | ICD-10-CM | POA: Diagnosis present

## 2020-05-18 DIAGNOSIS — R55 Syncope and collapse: Secondary | ICD-10-CM

## 2020-05-18 DIAGNOSIS — R2689 Other abnormalities of gait and mobility: Secondary | ICD-10-CM | POA: Diagnosis not present

## 2020-05-18 DIAGNOSIS — N179 Acute kidney failure, unspecified: Secondary | ICD-10-CM | POA: Diagnosis not present

## 2020-05-18 DIAGNOSIS — D72829 Elevated white blood cell count, unspecified: Secondary | ICD-10-CM | POA: Diagnosis present

## 2020-05-18 DIAGNOSIS — M6282 Rhabdomyolysis: Secondary | ICD-10-CM | POA: Diagnosis present

## 2020-05-18 DIAGNOSIS — E860 Dehydration: Secondary | ICD-10-CM

## 2020-05-18 DIAGNOSIS — T675XXA Heat exhaustion, unspecified, initial encounter: Secondary | ICD-10-CM | POA: Diagnosis present

## 2020-05-18 DIAGNOSIS — T671XXA Heat syncope, initial encounter: Secondary | ICD-10-CM | POA: Diagnosis not present

## 2020-05-18 DIAGNOSIS — S0083XA Contusion of other part of head, initial encounter: Secondary | ICD-10-CM | POA: Diagnosis present

## 2020-05-18 DIAGNOSIS — G600 Hereditary motor and sensory neuropathy: Secondary | ICD-10-CM | POA: Diagnosis present

## 2020-05-18 DIAGNOSIS — S51011A Laceration without foreign body of right elbow, initial encounter: Secondary | ICD-10-CM

## 2020-05-18 DIAGNOSIS — Z743 Need for continuous supervision: Secondary | ICD-10-CM | POA: Diagnosis not present

## 2020-05-18 DIAGNOSIS — M7731 Calcaneal spur, right foot: Secondary | ICD-10-CM | POA: Diagnosis not present

## 2020-05-18 DIAGNOSIS — X30XXXA Exposure to excessive natural heat, initial encounter: Secondary | ICD-10-CM | POA: Diagnosis not present

## 2020-05-18 DIAGNOSIS — M2550 Pain in unspecified joint: Secondary | ICD-10-CM | POA: Diagnosis not present

## 2020-05-18 DIAGNOSIS — R404 Transient alteration of awareness: Secondary | ICD-10-CM | POA: Diagnosis not present

## 2020-05-18 DIAGNOSIS — M170 Bilateral primary osteoarthritis of knee: Secondary | ICD-10-CM | POA: Diagnosis not present

## 2020-05-18 DIAGNOSIS — R7989 Other specified abnormal findings of blood chemistry: Secondary | ICD-10-CM | POA: Diagnosis not present

## 2020-05-18 DIAGNOSIS — M810 Age-related osteoporosis without current pathological fracture: Secondary | ICD-10-CM | POA: Diagnosis not present

## 2020-05-18 DIAGNOSIS — R748 Abnormal levels of other serum enzymes: Secondary | ICD-10-CM | POA: Diagnosis not present

## 2020-05-18 DIAGNOSIS — M7989 Other specified soft tissue disorders: Secondary | ICD-10-CM | POA: Diagnosis not present

## 2020-05-18 DIAGNOSIS — F17200 Nicotine dependence, unspecified, uncomplicated: Secondary | ICD-10-CM | POA: Diagnosis not present

## 2020-05-18 DIAGNOSIS — M25062 Hemarthrosis, left knee: Secondary | ICD-10-CM | POA: Diagnosis not present

## 2020-05-18 DIAGNOSIS — M179 Osteoarthritis of knee, unspecified: Secondary | ICD-10-CM | POA: Diagnosis not present

## 2020-05-18 DIAGNOSIS — T796XXA Traumatic ischemia of muscle, initial encounter: Secondary | ICD-10-CM

## 2020-05-18 DIAGNOSIS — M25462 Effusion, left knee: Secondary | ICD-10-CM | POA: Diagnosis not present

## 2020-05-18 DIAGNOSIS — I248 Other forms of acute ischemic heart disease: Secondary | ICD-10-CM | POA: Diagnosis present

## 2020-05-18 DIAGNOSIS — Z20822 Contact with and (suspected) exposure to covid-19: Secondary | ICD-10-CM | POA: Diagnosis present

## 2020-05-18 DIAGNOSIS — R2681 Unsteadiness on feet: Secondary | ICD-10-CM | POA: Diagnosis not present

## 2020-05-18 DIAGNOSIS — Y9301 Activity, walking, marching and hiking: Secondary | ICD-10-CM | POA: Diagnosis present

## 2020-05-18 DIAGNOSIS — Y92007 Garden or yard of unspecified non-institutional (private) residence as the place of occurrence of the external cause: Secondary | ICD-10-CM | POA: Diagnosis not present

## 2020-05-18 DIAGNOSIS — Z7401 Bed confinement status: Secondary | ICD-10-CM | POA: Diagnosis not present

## 2020-05-18 DIAGNOSIS — M6281 Muscle weakness (generalized): Secondary | ICD-10-CM | POA: Diagnosis not present

## 2020-05-18 DIAGNOSIS — R778 Other specified abnormalities of plasma proteins: Secondary | ICD-10-CM | POA: Diagnosis not present

## 2020-05-18 DIAGNOSIS — W19XXXA Unspecified fall, initial encounter: Secondary | ICD-10-CM | POA: Diagnosis not present

## 2020-05-18 DIAGNOSIS — M19071 Primary osteoarthritis, right ankle and foot: Secondary | ICD-10-CM | POA: Diagnosis not present

## 2020-05-18 LAB — ECHOCARDIOGRAM COMPLETE
AR max vel: 1.58 cm2
AV Area VTI: 1.61 cm2
AV Area mean vel: 1.44 cm2
AV Mean grad: 4 mmHg
AV Peak grad: 6.7 mmHg
Ao pk vel: 1.29 m/s
Area-P 1/2: 3.4 cm2
Calc EF: 68.9 %
Height: 72 in
Single Plane A2C EF: 70.1 %
Single Plane A4C EF: 69.1 %
Weight: 2582.4 oz

## 2020-05-18 LAB — CBC
HCT: 35.4 % — ABNORMAL LOW (ref 39.0–52.0)
Hemoglobin: 11.7 g/dL — ABNORMAL LOW (ref 13.0–17.0)
MCH: 32.6 pg (ref 26.0–34.0)
MCHC: 33.1 g/dL (ref 30.0–36.0)
MCV: 98.6 fL (ref 80.0–100.0)
Platelets: 148 10*3/uL — ABNORMAL LOW (ref 150–400)
RBC: 3.59 MIL/uL — ABNORMAL LOW (ref 4.22–5.81)
RDW: 13.6 % (ref 11.5–15.5)
WBC: 13.4 10*3/uL — ABNORMAL HIGH (ref 4.0–10.5)
nRBC: 0 % (ref 0.0–0.2)

## 2020-05-18 LAB — HEMOGLOBIN A1C
Hgb A1c MFr Bld: 5.1 % (ref 4.8–5.6)
Mean Plasma Glucose: 100 mg/dL

## 2020-05-18 LAB — BASIC METABOLIC PANEL
Anion gap: 12 (ref 5–15)
BUN: 21 mg/dL (ref 8–23)
CO2: 18 mmol/L — ABNORMAL LOW (ref 22–32)
Calcium: 8.3 mg/dL — ABNORMAL LOW (ref 8.9–10.3)
Chloride: 106 mmol/L (ref 98–111)
Creatinine, Ser: 1.14 mg/dL (ref 0.61–1.24)
GFR calc Af Amer: >60 mL/min (ref >60)
GFR calc non Af Amer: 58 mL/min — ABNORMAL LOW (ref >60)
Glucose, Bld: 99 mg/dL (ref 70–99)
Potassium: 3.5 mmol/L (ref 3.5–5.1)
Sodium: 136 mmol/L (ref 135–145)

## 2020-05-18 LAB — CK: Total CK: 535 U/L — ABNORMAL HIGH (ref 49–397)

## 2020-05-18 MED ORDER — MUPIROCIN CALCIUM 2 % EX CREA
TOPICAL_CREAM | Freq: Every day | CUTANEOUS | Status: DC
  Filled 2020-05-18 (×2): qty 15

## 2020-05-18 MED ORDER — BUPIVACAINE HCL (PF) 0.5 % IJ SOLN
10.0000 mL | Freq: Once | INTRAMUSCULAR | Status: AC
  Administered 2020-05-18: 10 mL
  Filled 2020-05-18: qty 10

## 2020-05-18 MED ORDER — TRIAMCINOLONE ACETONIDE 40 MG/ML IJ SUSP
80.0000 mg | Freq: Once | INTRAMUSCULAR | Status: AC
  Administered 2020-05-18: 80 mg via INTRA_ARTICULAR
  Filled 2020-05-18: qty 2

## 2020-05-18 MED ORDER — TRAMADOL HCL 50 MG PO TABS: 50.0000 mg | ORAL_TABLET | Freq: Four times a day (QID) | ORAL | Status: DC | PRN

## 2020-05-18 NOTE — Consult Note (Signed)
ORTHOPAEDIC CONSULTATION  REQUESTING PHYSICIAN: Enedina Finner, MD  Chief Complaint:   Bilateral knee pain with left knee swelling.  History of Present Illness: Keith Morris is a 86 y.o. male with a history of tobacco abuse who was admitted 2 days ago for 2 syncopal episodes at home.  Apparently, he was mowing the lawn on Wednesday afternoon and apparently became lightheaded and fell.  He then went into the house and fell again.  He was brought to the emergency room and subsequently admitted for further work-up of his syncopal episodes.  The patient was evaluated by physical therapy but had difficulty ambulating and therefore the recommendation is for him to go to rehab.  However, the patient normally lives independently and would like to try to go home.  Therefore, I have been consulted to see if an aspiration/injection of his left knee might improve his ability to ambulate more safely.  History reviewed. No pertinent past medical history. Past Surgical History:  Procedure Laterality Date  . foot surgery bilaterally     Social History   Socioeconomic History  . Marital status: Married    Spouse name: Not on file  . Number of children: Not on file  . Years of education: Not on file  . Highest education level: Not on file  Occupational History  . Not on file  Tobacco Use  . Smoking status: Current Every Day Smoker  . Smokeless tobacco: Never Used  Substance and Sexual Activity  . Alcohol use: Not Currently  . Drug use: Never  . Sexual activity: Not on file  Other Topics Concern  . Not on file  Social History Narrative  . Not on file   Social Determinants of Health   Financial Resource Strain:   . Difficulty of Paying Living Expenses:   Food Insecurity:   . Worried About Programme researcher, broadcasting/film/video in the Last Year:   . Barista in the Last Year:   Transportation Needs:   . Freight forwarder (Medical):   Marland Kitchen  Lack of Transportation (Non-Medical):   Physical Activity:   . Days of Exercise per Week:   . Minutes of Exercise per Session:   Stress:   . Feeling of Stress :   Social Connections:   . Frequency of Communication with Friends and Family:   . Frequency of Social Gatherings with Friends and Family:   . Attends Religious Services:   . Active Member of Clubs or Organizations:   . Attends Banker Meetings:   Marland Kitchen Marital Status:    History reviewed. No pertinent family history. No Known Allergies Prior to Admission medications   Medication Sig Start Date End Date Taking? Authorizing Provider  acetaminophen (TYLENOL) 500 MG tablet Take 1,000 mg by mouth every 6 (six) hours as needed.   Yes [provider]  aspirin 81 MG chewable tablet Chew 81 mg by mouth daily.   Yes [provider]   DG Ankle Complete Right  Result Date: 05/18/2020 CLINICAL DATA:  Soft tissue swelling EXAM: RIGHT ANKLE - COMPLETE 3+ VIEW COMPARISON:  None. FINDINGS: Frontal, oblique, and lateral views were obtained. There is soft tissue swelling. There is no fracture or appreciable joint effusion. There is narrowing in the ankle joint region consistent with a degree of osteoarthritic change. No erosion. There is degenerative change throughout the subtalar joint regions with narrowing severe in the posterior subtalar joint region. Ankle mortise appears grossly intact. There is a small posterior calcaneal spur. Underlying  osteoporosis noted. IMPRESSION: Bones osteoporotic. There is osteoarthritic change in the ankle and subtalar joint regions, most severe in the posterior subtalar joint region. Small posterior calcaneal spur. Soft tissue swelling. No evident fracture. Ankle mortise appears grossly intact. Electronically Signed   By: Bretta Bang III M.D.   On: 05/18/2020 14:11   CT Head Wo Contrast  Result Date: 05/16/2020 CLINICAL DATA:  Head injury, fall, facial trauma EXAM: CT HEAD WITHOUT  CONTRAST CT CERVICAL SPINE WITHOUT CONTRAST TECHNIQUE: Multidetector CT imaging of the head and cervical spine was performed following the standard protocol without intravenous contrast. Multiplanar CT image reconstructions of the cervical spine were also generated. COMPARISON:  None. FINDINGS: CT HEAD FINDINGS Brain: Normal anatomic configuration. Mild to moderate parenchymal volume loss is commensurate with the patient's age. Mild periventricular white matter changes are present likely reflecting the sequela of small vessel ischemia. No abnormal intra or extra-axial mass lesion or fluid collection. No abnormal mass effect or midline shift. No evidence of acute intracranial hemorrhage or infarct. Ventricular size is normal. Cerebellum unremarkable. Vascular: Moderate atherosclerotic calcification is seen within the carotid siphons bilaterally. No asymmetric hyperdense vasculature noted at the skull base. Skull: Intact Sinuses/Orbits: Paranasal sinuses are clear. Orbits are unremarkable. Other: Mastoid air cells and middle ear cavities are clear. Small scalp hematoma seen within the right supraorbital region. CT CERVICAL SPINE FINDINGS Alignment: Normal cervical lordosis. No listhesis of the cervical spine. Skull base and vertebrae: The craniocervical junction is unremarkable. The atlantodental interval is normal. Vertebral body height has been preserved. No acute fracture of the cervical spine. No lytic or blastic bone lesion. Soft tissues and spinal canal: Moderate atherosclerotic calcifications seen within the carotid bifurcations bilaterally. No prevertebral soft tissue swelling or paraspinal fluid collections identified. No visible canal hematoma. Disc levels: There is intervertebral disc space narrowing and endplate remodeling of C3-T2 in keeping with changes of diffuse moderate to severe degenerative disc disease. There is degenerative ankylosis of the vertebral bodies of C4 and C5. Review of the axial images  demonstrates: C1-2: Moderate atlantodental degenerative arthritis. C2-3: Severe left facet arthrosis results in moderate left neural foraminal narrowing. No significant canal stenosis. C3-4: Severe bilateral uncovertebral arthrosis, mild left facet arthrosis, and severe right facet arthrosis results in severe right neural foraminal narrowing and moderate left neural foraminal narrowing. Mild effacement of the anterior canal space. No significant canal stenosis. C4-5: Moderate bilateral uncovertebral arthrosis and mild right and moderate left facet arthrosis results in severe left and moderate to severe right neural foraminal narrowing. Effacement of the a anterior canal space without significant canal stenosis. C5-6: Mild osteophytic ridging and mild bilateral facet arthrosis results in mild left and mild-to-moderate right neural foraminal narrowing. No significant canal stenosis. C6-7: Moderate bilateral uncovertebral arthrosis and mild facet arthrosis results in moderate bilateral neural foraminal narrowing. C7-T1: Moderate bilateral facet arthrosis. No significant neural foraminal narrowing. No canal stenosis. Upper chest: The visualized lung apices are unremarkable. Other: None significant IMPRESSION: 1. Small right supraorbital scalp hematoma. 2. No acute intracranial abnormality. 3. No acute fracture or subluxation of the cervical spine. 4. Moderate to severe multilevel degenerative disc disease and facet arthrosis of the cervical spine, most prominent at C3-T2. Electronically Signed   By: Helyn Numbers MD   On: 05/16/2020 21:19   CT Cervical Spine Wo Contrast  Result Date: 05/16/2020 CLINICAL DATA:  Head injury, fall, facial trauma EXAM: CT HEAD WITHOUT CONTRAST CT CERVICAL SPINE WITHOUT CONTRAST TECHNIQUE: Multidetector CT imaging of the head and  cervical spine was performed following the standard protocol without intravenous contrast. Multiplanar CT image reconstructions of the cervical spine were also  generated. COMPARISON:  None. FINDINGS: CT HEAD FINDINGS Brain: Normal anatomic configuration. Mild to moderate parenchymal volume loss is commensurate with the patient's age. Mild periventricular white matter changes are present likely reflecting the sequela of small vessel ischemia. No abnormal intra or extra-axial mass lesion or fluid collection. No abnormal mass effect or midline shift. No evidence of acute intracranial hemorrhage or infarct. Ventricular size is normal. Cerebellum unremarkable. Vascular: Moderate atherosclerotic calcification is seen within the carotid siphons bilaterally. No asymmetric hyperdense vasculature noted at the skull base. Skull: Intact Sinuses/Orbits: Paranasal sinuses are clear. Orbits are unremarkable. Other: Mastoid air cells and middle ear cavities are clear. Small scalp hematoma seen within the right supraorbital region. CT CERVICAL SPINE FINDINGS Alignment: Normal cervical lordosis. No listhesis of the cervical spine. Skull base and vertebrae: The craniocervical junction is unremarkable. The atlantodental interval is normal. Vertebral body height has been preserved. No acute fracture of the cervical spine. No lytic or blastic bone lesion. Soft tissues and spinal canal: Moderate atherosclerotic calcifications seen within the carotid bifurcations bilaterally. No prevertebral soft tissue swelling or paraspinal fluid collections identified. No visible canal hematoma. Disc levels: There is intervertebral disc space narrowing and endplate remodeling of C3-T2 in keeping with changes of diffuse moderate to severe degenerative disc disease. There is degenerative ankylosis of the vertebral bodies of C4 and C5. Review of the axial images demonstrates: C1-2: Moderate atlantodental degenerative arthritis. C2-3: Severe left facet arthrosis results in moderate left neural foraminal narrowing. No significant canal stenosis. C3-4: Severe bilateral uncovertebral arthrosis, mild left facet  arthrosis, and severe right facet arthrosis results in severe right neural foraminal narrowing and moderate left neural foraminal narrowing. Mild effacement of the anterior canal space. No significant canal stenosis. C4-5: Moderate bilateral uncovertebral arthrosis and mild right and moderate left facet arthrosis results in severe left and moderate to severe right neural foraminal narrowing. Effacement of the a anterior canal space without significant canal stenosis. C5-6: Mild osteophytic ridging and mild bilateral facet arthrosis results in mild left and mild-to-moderate right neural foraminal narrowing. No significant canal stenosis. C6-7: Moderate bilateral uncovertebral arthrosis and mild facet arthrosis results in moderate bilateral neural foraminal narrowing. C7-T1: Moderate bilateral facet arthrosis. No significant neural foraminal narrowing. No canal stenosis. Upper chest: The visualized lung apices are unremarkable. Other: None significant IMPRESSION: 1. Small right supraorbital scalp hematoma. 2. No acute intracranial abnormality. 3. No acute fracture or subluxation of the cervical spine. 4. Moderate to severe multilevel degenerative disc disease and facet arthrosis of the cervical spine, most prominent at C3-T2. Electronically Signed   By: Helyn Numbers MD   On: 05/16/2020 21:19   MR BRAIN WO CONTRAST  Result Date: 05/17/2020 CLINICAL DATA:  Syncope, head injury EXAM: MRI HEAD WITHOUT CONTRAST TECHNIQUE: Multiplanar, multiecho pulse sequences of the brain and surrounding structures were obtained without intravenous contrast. COMPARISON:  Correlation made with 05/16/2020 head CT FINDINGS: Brain: There is no acute infarction or intracranial hemorrhage. There is no intracranial mass, mass effect, or edema. There is no hydrocephalus or extra-axial fluid collection. Prominence of the ventricles and sulci reflects moderate generalized parenchymal volume loss. Patchy and confluent areas of T2  hyperintensity in the supratentorial white matter are nonspecific but may reflect moderate chronic microvascular ischemic changes. Vascular: Major vessel flow voids at the skull base are preserved. Skull and upper cervical spine: Normal marrow signal is  preserved. Sinuses/Orbits: Paranasal sinuses are aerated. Orbits are unremarkable. Other: Sella is unremarkable.  Mastoid air cells are clear. IMPRESSION: No evidence of recent infarction, hemorrhage, or mass. Moderate chronic microvascular ischemic changes. Electronically Signed   By: Guadlupe Spanish M.D.   On: 05/17/2020 14:53   DG Knee Complete 4 Views Left  Result Date: 05/17/2020 CLINICAL DATA:  Fall yesterday. EXAM: LEFT KNEE - COMPLETE 4+ VIEW COMPARISON:  Right knee radiographs 05/17/2020 FINDINGS: Advanced tricompartmental degenerative changes are noted in the knee. A moderate to large effusion is present. No discrete fracture is evident. Vascular calcifications are noted. IMPRESSION: 1. Moderate to large effusion. 2. No discrete fracture.  Ligamentous injury is not excluded. 3. Advanced tricompartmental degenerative changes. Electronically Signed   By: Marin Roberts M.D.   On: 05/17/2020 06:44   DG Knee Complete 4 Views Right  Result Date: 05/17/2020 CLINICAL DATA:  Bilateral knee pain.  Fall yesterday. EXAM: RIGHT KNEE - COMPLETE 4+ VIEW COMPARISON:  Left knee radiographs 05/17/2020 FINDINGS: Advanced degenerative changes are present with asymmetric joint space loss laterally. Patellofemoral disease is present. Small effusion is noted. No acute or healing fractures are present. Vascular calcifications are present. IMPRESSION: 1. Small effusion. 2. No acute fractures. 3. Advanced tricompartmental degenerative changes. 4. Atherosclerosis. Electronically Signed   By: Marin Roberts M.D.   On: 05/17/2020 06:41   ECHOCARDIOGRAM COMPLETE  Result Date: 05/18/2020    ECHOCARDIOGRAM REPORT   Patient Name:   Keith Morris Date of Exam:  05/18/2020 Medical Rec #:  [id number]   Height:       72.0 in Accession #:    [id number]  Weight:       161.4 lb Date of Birth:  [birth year]    BSA:          1.945 m Patient Age:    88 years    BP:           145/83 mmHg Patient Gender: M           HR:           76 bpm. Exam Location:  [facility] Procedure: 2D Echo, Color Doppler and Cardiac Doppler Indications:     R55 Syncope  History:         Patient has no prior history of Echocardiogram examinations.                  Risk Factors:Current Smoker.  Sonographer:     Humphrey Rolls RDCS (AE) Referring Phys:  Kern Reap Lorretta Harp Diagnosing Phys: Julien Nordmann MD  Sonographer Comments: Technically difficult study due to poor echo windows. TDS due to inability of pt to turn LLD. IMPRESSIONS  1. Left ventricular ejection fraction, by estimation, is 60 to 65%. The left ventricle has normal function. The left ventricle has no regional wall motion abnormalities. Left ventricular diastolic parameters are consistent with Grade I diastolic dysfunction (impaired relaxation).  2. Right ventricular systolic function is normal. The right ventricular size is normal. Tricuspid regurgitation signal is inadequate for assessing PA pressure.  3. Challeng ing images. FINDINGS  Left Ventricle: Left ventricular ejection fraction, by estimation, is 60 to 65%. The left ventricle has normal function. The left ventricle has no regional wall motion abnormalities. The left ventricular internal cavity size was normal in size. There is  no left ventricular hypertrophy. Left ventricular diastolic parameters are consistent with Grade I diastolic dysfunction (impaired relaxation). Right Ventricle: The right ventricular size is normal. No increase in right ventricular wall thickness.  Right ventricular systolic function is normal. Tricuspid regurgitation signal is inadequate for assessing PA pressure. Left Atrium: Left atrial size was normal in size. Right Atrium: Right atrial size was normal in size. Pericardium:  There is no evidence of pericardial effusion. Mitral Valve: The mitral valve was not well visualized. Normal mobility of the mitral valve leaflets. No evidence of mitral valve regurgitation. No evidence of mitral valve stenosis. MV peak gradient, 3.5 mmHg. The mean mitral valve gradient is 2.0 mmHg. Tricuspid Valve: The tricuspid valve is not well visualized. Tricuspid valve regurgitation is not demonstrated. No evidence of tricuspid stenosis. Aortic Valve: The aortic valve was not well visualized. Aortic valve regurgitation is not visualized. No aortic stenosis is present. Aortic valve mean gradient measures 4.0 mmHg. Aortic valve peak gradient measures 6.7 mmHg. Aortic valve area, by VTI measures 1.61 cm. Pulmonic Valve: The pulmonic valve was not well visualized. Pulmonic valve regurgitation is not visualized. No evidence of pulmonic stenosis. Aorta: The aortic root is normal in size and structure. Venous: The inferior vena cava is normal in size with greater than 50% respiratory variability, suggesting right atrial pressure of 3 mmHg. IAS/Shunts: No atrial level shunt detected by color flow Doppler.  LEFT VENTRICLE PLAX 2D LVOT diam:     1.80 cm     Diastology LV SV:         42          LV e' lateral:   5.44 cm/s LV SV Index:   21          LV E/e' lateral: 17.9 LVOT Area:     2.54 cm    LV e' medial:    6.53 cm/s                            LV E/e' medial:  14.9  LV Volumes (MOD) LV vol d, MOD A2C: 51.1 ml LV vol d, MOD A4C: 62.5 ml LV vol s, MOD A2C: 15.3 ml LV vol s, MOD A4C: 19.3 ml LV SV MOD A2C:     35.8 ml LV SV MOD A4C:     62.5 ml LV SV MOD BP:      39.1 ml LEFT ATRIUM           Index LA Vol (A2C): 11.3 ml 5.81 ml/m LA Vol (A4C): 41.1 ml 21.13 ml/m  AORTIC VALVE AV Area (Vmax):    1.58 cm AV Area (Vmean):   1.44 cm AV Area (VTI):     1.61 cm AV Vmax:           129.00 cm/s AV Vmean:          95.000 cm/s AV VTI:            0.260 m AV Peak Grad:      6.7 mmHg AV Mean Grad:      4.0 mmHg LVOT Vmax:          80.30 cm/s LVOT Vmean:        53.800 cm/s LVOT VTI:          0.164 m LVOT/AV VTI ratio: 0.63  AORTA Ao Root diam: 2.90 cm MITRAL VALVE MV Area (PHT): 3.40 cm     SHUNTS MV Peak grad:  3.5 mmHg     Systemic VTI:  0.16 m MV Mean grad:  2.0 mmHg     Systemic Diam: 1.80 cm MV Vmax:       0.93 m/s  MV Vmean:      64.4 cm/s MV Decel Time: 223 msec MV E velocity: 97.30 cm/s MV A velocity: 104.00 cm/s MV E/A ratio:  0.94 Julien Nordmannimothy Gollan MD Electronically signed by Julien Nordmannimothy Gollan MD Signature Date/Time: 05/18/2020/1:47:30 PM    Final     Positive ROS: All other systems have been reviewed and were otherwise negative with the exception of those mentioned in the HPI and as above.  Physical Exam: General:  Alert, no acute distress Psychiatric:  Patient is competent for consent with normal mood and affect   Cardiovascular:  No pedal edema Respiratory:  No wheezing, non-labored breathing GI:  Abdomen is soft and non-tender Skin:  No lesions in the area of chief complaint Neurologic:  Sensation intact distally Lymphatic:  No axillary or cervical lymphadenopathy  Orthopedic Exam:  Examination of the left knee demonstrates mild swelling as well as a 1-2+ effusion.  Skin inspection otherwise is unremarkable.  No erythema, ecchymosis, abrasions, or other skin abnormalities are identified.  There is moderate tenderness to palpation along the lateral joint line and lateral patellofemoral compartment, but only minimal tenderness along the medial joint line.  Actively, he is able to perform straight leg raise.  He can range his knee from approximately 30 to 70 degrees actively.  Passively, his knee motion is not much different, presumably due to his underlying arthritis.  He is neurovascularly intact to the left lower extremity and foot.  Examination of the right knee demonstrates perhaps minimal swelling as well as a small effusion.  Skin inspection otherwise is unremarkable.  No erythema, ecchymosis, abrasions, or  other skin abnormalities are identified.  There is mild tenderness to palpation along the lateral joint line and lateral patellofemoral compartment, but only minimal tenderness along the medial joint line.  Actively, he is able to perform straight leg raise.  He can range his knee from approximately 30 to 70 degrees actively.  Passively, his knee motion is not much different, presumably due to his underlying arthritis.  He is neurovascularly intact to the right lower extremity and foot.  X-rays:  Recent x-rays of the left knee are available for review and have been reviewed by myself.  These films demonstrate severe tricompartmental degenerative changes, most pronounced in the lateral and patellofemoral compartments.  No lytic lesions or fractures are identified.  Recent x-rays of the right knee are available for review and have been reviewed by myself.  These films also demonstrate severe tricompartmental degenerative changes, most pronounced in the lateral and patellofemoral compartments.  No lytic lesions or fractures are identified.  Assessment: Severe tricompartmental degenerative joint disease of both knees, left more symptomatic than right, with a left knee hemarthrosis.  Plan: Treatment options have been discussed with the patient and his son, who is at the bedside.  The patient is interested in undergoing an aspiration with steroid injection of the left knee to see if this might help alleviate his symptoms and improve his ability to ambulate with his walker.  After obtaining verbal consent, the left knee is aspirated sterilely of 10 cc of bloody fluid before being injected with 1 cc of Kenalog 40 and 9 cc of 0.5% Sensorcaine.  The patient tolerated the procedure well.  He is advised to take it easy tonight, but may then resume ambulation with physical therapy in the morning.    Thank you for asking me to participate in the care of this delightful man.  I will be happy to follow him with you.  If he feels that this injection is sufficiently beneficial, we may do the same procedure on his right knee.   Maryagnes Amos, MD  Beeper #:  [phone]  05/18/2020 5:35 PM

## 2020-05-18 NOTE — Consult Note (Signed)
WOC Nurse Consult Note: Reason for Consult:syncopal episode with skin tear to right forearm.   Wound type:trauma Pressure Injury POA: NA Measurement: 5 cm skin tear with nonapproximated edges.  Wound XBM:WUXLK red Drainage (amount, consistency, odor) minimal serosanguinous  No odor.  Periwound:bruising Dressing procedure/placement/frequency: cleanse right forearm skin tear with NS.  Apply mupirocin ointment and dry dressing/kerlix/tape. Change daily.  Will not follow at this time.  Please re-consult if needed.  Maple Hudson MSN, RN, FNP-BC CWON Wound, Ostomy, Continence Nurse Pager [phone]

## 2020-05-18 NOTE — Evaluation (Signed)
Occupational Therapy Evaluation Patient Details Name: Keith Morris MRN: [id number] DOB: July 15, 1932 Today's Date: 05/18/2020    History of Present Illness From MD: Keith Morris is a 86 y.o. male with medical history significant of tobacco abuse, who presents with syncope. Per her daughter, patient had 2 episodes of syncope.  First episode happened while patient was mowing the lawn at about 2:30 PM yesterday. Pt had another episode of syncope when patient moved to inside of the house.  Patient states that he lost control of both legs.  No seizure activity.  Patient fell and hit his head.  Patient has a skin tear in the right forearm.  He also has hematoma in the right forehead.  Patient does not have unilateral numbness or tingling in extremities. No facial droop or slurred speech.  Denies chest pain, shortness breath, cough, fever or chills.  No nausea vomiting, diarrhea, abdominal pain, symptoms of UTI.   Clinical Impression   Pt was seen for OT evaluation this date. Prior to hospital admission, pt was Indep with self care ADLs . Pt lives alone in  [facility] with 1 STE. Currently pt demonstrates impairments as described below (See OT problem list) which functionally limit his ability to perform ADL/self-care tasks. Pt currently requires MIN/MOD A with seated UB ADLs and MAX to TOTAL A with LB ADLs and unable to come to standing with MAX A of 1 person this date, anticipate pt could benefit from progressing functional transfers/mobility with 2 person assist next session based on today's assessment.  Pt would benefit from skilled OT to address noted impairments and functional limitations (see below for any additional details) in order to maximize safety and independence while minimizing falls risk and caregiver burden. Upon hospital discharge, recommend STR to maximize pt safety and return to PLOF.     Follow Up Recommendations  SNF    Equipment Recommendations  3 in 1 bedside commode;Other (comment) (grab bars  in restroom)    Recommendations for Other Services       Precautions / Restrictions Precautions Precautions: Fall Precaution Comments: Pt has skin tear on R forearm Restrictions Weight Bearing Restrictions: No      Mobility Bed Mobility Overal bed mobility: Needs Assistance Bed Mobility: Supine to Sit;Rolling;Sit to Supine Rolling: Mod assist   Supine to sit: Mod assist;HOB elevated Sit to supine: Mod assist   General bed mobility comments: MOD verbal cues for use of hands/reaching for railing, use of foot to propel  Transfers Overall transfer level: Needs assistance Equipment used: Rolling walker (2 wheeled);1 person hand held assist Transfers: Sit to/from Stand Sit to Stand: Max assist         General transfer comment: unsuccessful in attempt to CTS from EOB with MAX A with RW and with MAX A with arm in arm technique. Knee and foot blocking also provided. Pt unable to clear bottom more than a few inches. Anticipate 2p assist needed.    Balance Overall balance assessment: Needs assistance Sitting-balance support: Bilateral upper extremity supported;Feet supported Sitting balance-Leahy Scale: Poor Sitting balance - Comments: HOB elevated and pillow in place on R side to provide seated support while in EOB sitting. Postural control: Posterior lean     Standing balance comment: NT                           ADL either performed or assessed with clinical judgement   ADL Overall ADL's : Needs assistance/impaired Eating/Feeding: Minimal assistance;Sitting Eating/Feeding  Details (indicate cue type and reason): MIN A for hand to mouth d/t decreased grasp and FMC, would benefit from large handles for utensils Grooming: Minimal assistance;Moderate assistance;Sitting           Upper Body Dressing : Minimal assistance;Sitting   Lower Body Dressing: Maximal assistance;Total assistance;Bed level Lower Body Dressing Details (indicate cue type and reason): PT  makes good effort to participate in LB dressing, but cannot tolerate 2/2 pain   Toilet Transfer Details (indicate cue type and reason): unable to transfer, anticipate 2p assists versus potential lift needed         Functional mobility during ADLs:  (unable)       Vision Patient Visual Report: No change from baseline       Perception     Praxis      Pertinent Vitals/Pain Pain Assessment: Faces Faces Pain Scale: Hurts little more Pain Location: R ankle with attempts to mobilize Pain Descriptors / Indicators: Sore;Tender Pain Intervention(s): Limited activity within patient's tolerance;Monitored during session     Hand Dominance     Extremity/Trunk Assessment Upper Extremity Assessment Upper Extremity Assessment: Defer to OT evaluation;Generalized weakness;RUE deficits/detail;LUE deficits/detail RUE Coordination: decreased fine motor;decreased gross motor LUE Coordination: decreased fine motor;decreased gross motor   Lower Extremity Assessment Lower Extremity Assessment: Generalized weakness       Communication     Cognition Arousal/Alertness: Awake/alert Behavior During Therapy: WFL for tasks assessed/performed Overall Cognitive Status: Within Functional Limits for tasks assessed                                 General Comments: Pt with some delayed reposnses, increased processing time, and some decreased safety awareness/lack of insight into deficits. But oriented and appropriate with commands.   General Comments       Exercises Other Exercises Other Exercises: OT factilitates ed re: role of OT in acute setting, safety considerations, fall prevention, use of call light. Pt with good verbalized understanding, but some lack of insight into deficits and decreased safety awareness.   Shoulder Instructions      Home Living Family/patient expects to be discharged to:: Private residence Living Arrangements: Alone Available Help at Discharge:  Family;Available PRN/intermittently Type of Home: House Home Access: Other (comment) (brick threshold)     Home Layout: One level;Able to live on main level with bedroom/bathroom     Bathroom Shower/Tub: Tub/shower unit;Other (comment)   Bathroom Toilet: Standard Bathroom Accessibility: Yes   Home Equipment: Walker - 2 wheels;Walker - 4 wheels;Cane - single point          Prior Functioning/Environment Level of Independence: Independent with assistive device(s);Needs assistance  Gait / Transfers Assistance Needed: pt reports he is able to walk and was mowing the lawn when fall occurred. Reports this is the only fall in a year, but unsure of efficacy. ADL's / Homemaking Assistance Needed: Pt reports his daughter/son take care of purchasing groceries and helping out around the house.  Also notes that he has had nursing come to assist with bathing previously, but not sure if he is still receiving.            OT Problem List: Decreased strength;Decreased range of motion;Decreased activity tolerance;Impaired balance (sitting and/or standing);Decreased coordination;Decreased safety awareness;Decreased knowledge of use of DME or AE;Impaired tone;Impaired UE functional use;Pain;Increased edema      OT Treatment/Interventions: Self-care/ADL training;Therapeutic exercise;DME and/or AE instruction;Therapeutic activities;Balance training;Patient/family education    OT  Goals(Current goals can be found in the care plan section) Acute Rehab OT Goals Patient Stated Goal: get to where I can move ariound better so I can go home OT Goal Formulation: With patient Time For Goal Achievement: 06/01/20 Potential to Achieve Goals: Fair ADL Goals Pt Will Perform Eating: with supervision;sitting (supported sitting, with built up vesus weighted utensils and cup with handle/lid) Pt Will Perform Upper Body Dressing: with supervision;sitting Pt Will Transfer to Toilet: with max assist;with +2 assist (scoot  to drop arm BSC/SPS to standard BSC as appropriate) Additional ADL Goal #1: Pt will tolerate further mobilization assessment with OT in order to further develop ADL transfer/mobility goals.  OT Frequency: Min 1X/week   Barriers to D/C: Decreased caregiver support  son/dtr involved in care, but pt lives alone       Co-evaluation              AM-PAC OT "6 Clicks" Daily Activity     Outcome Measure Help from another person eating meals?: A Little Help from another person taking care of personal grooming?: A Little Help from another person toileting, which includes using toliet, bedpan, or urinal?: A Lot Help from another person bathing (including washing, rinsing, drying)?: A Lot Help from another person to put on and taking off regular upper body clothing?: A Little Help from another person to put on and taking off regular lower body clothing?: A Lot 6 Click Score: 15   End of Session Equipment Utilized During Treatment: Gait belt;Rolling walker Nurse Communication: Mobility status  Activity Tolerance: Patient tolerated treatment well;Patient limited by pain Patient left: in bed;with call bell/phone within reach;with bed alarm set;with nursing/sitter in room (CNA present to replace condom catheter)  OT Visit Diagnosis: Unsteadiness on feet (R26.81);Other abnormalities of gait and mobility (R26.89);Muscle weakness (generalized) (M62.81);History of falling (Z91.81)                Time: 6962-9528 OT Time Calculation (min): 46 min Charges:  OT General Charges $OT Visit: 1 Visit OT Evaluation $OT Eval Moderate Complexity: 1 Mod OT Treatments $Self Care/Home Management : 8-22 mins $Therapeutic Activity: 8-22 mins  Rejeana Brock, MS, OTR/L ascom [phone] 05/18/20, 4:36 PM

## 2020-05-18 NOTE — Progress Notes (Signed)
Attempted to notify pt's daughter about pt being moved to room 218. Daughter is aware that pt would be transferred to another unit this shift. Daughter wanted to be notified of what room pt will be placed in.

## 2020-05-18 NOTE — Progress Notes (Signed)
*  PRELIMINARY RESULTS* Echocardiogram 2D Echocardiogram has been performed.  Keith Morris 05/18/2020, 8:45 AM

## 2020-05-18 NOTE — Evaluation (Signed)
Physical Therapy Evaluation Patient Details Name: Keith Morris MRN: [id number] DOB: 03-Jul-1932 Today's Date: 05/18/2020   History of Present Illness  From MD: Keith Morris is a 86 y.o. male with medical history significant of tobacco abuse, who presents with syncope. Per her daughter, patient had 2 episodes of syncope.  First episode happened while patient was mowing the lawn at about 2:30 PM yesterday. Pt had another episode of syncope when patient moved to inside of the house.  Patient states that he lost control of both legs.  No seizure activity.  Patient fell and hit his head.  Patient has a skin tear in the right forearm.  He also has hematoma in the right forehead.  Patient does not have unilateral numbness or tingling in extremities. No facial droop or slurred speech.  Denies chest pain, shortness breath, cough, fever or chills.  No nausea vomiting, diarrhea, abdominal pain, symptoms of UTI.     Clinical Impression  Pt received in bed with breakfast on tray.  Pt reported that he was not going to eat yet, and was agreeable to therapy.  Pt notes he has not been up to walk since being admitted in the hospital.  Pt required modA from therapist and use of UE's in order to pull into sitting position.  Pt also has decreased trunk control and has posterior lean that requires hand grip along the edge of bed to remain upright.  Pt then performed bedside exercises with moderate difficulty.  Pt attempted to stand, but was unable to do so and refused therapist assistance.  Pt then transferred back to supine in bed where therapeutic exercises were continued.  Pt left in bed with all needs within reach.  Pt will benefit from PT services in a SNF setting upon discharge to safely address deficits listed in patient problem list for decreased caregiver assistance and eventual return to PLOF.       Follow Up Recommendations SNF    Equipment Recommendations  3in1 (PT)    Recommendations for Other Services        Precautions / Restrictions Precautions Precautions: Fall Precaution Comments: Pt has skin tear on R forearm Restrictions Weight Bearing Restrictions: No      Mobility  Bed Mobility Overal bed mobility: Needs Assistance Bed Mobility: Supine to Sit     Supine to sit: Mod assist     General bed mobility comments: Pt requires modA for remaining upright and using hand to pull to sitting.  Transfers                    Ambulation/Gait                Stairs            Wheelchair Mobility    Modified Rankin (Stroke Patients Only)       Balance Overall balance assessment: Needs assistance Sitting-balance support: Bilateral upper extremity supported Sitting balance-Leahy Scale: Poor Sitting balance - Comments: Pt requires grip on side of bed in order to remain upright and at times, assistance from therapist to prevent posterior lean. Postural control: Posterior lean                                   Pertinent Vitals/Pain Pain Assessment: No/denies pain    Home Living Family/patient expects to be discharged to:: Private residence Living Arrangements: Alone Available Help at Discharge: Family;Available PRN/intermittently Type of Home:  House Home Access: Other (comment) (Brick Threshold)     Home Layout: One level;Able to live on main level with bedroom/bathroom Home Equipment: Dan Humphreys - 2 wheels;Walker - 4 wheels;Cane - single point      Prior Function Level of Independence: Independent with assistive device(s);Needs assistance      ADL's / Homemaking Assistance Needed: Pt reports his daughter/son take care of purchasing groceries and helping out around the house.  Also notes that he has had nursing come to assist with bathing previously, but not sure if he is still receiving.        Hand Dominance        Extremity/Trunk Assessment   Upper Extremity Assessment Upper Extremity Assessment: Defer to OT evaluation     Lower Extremity Assessment Lower Extremity Assessment: Generalized weakness       Communication      Cognition Arousal/Alertness: Awake/alert Behavior During Therapy: WFL for tasks assessed/performed Overall Cognitive Status: Within Functional Limits for tasks assessed                                 General Comments: Pt can respond to tasks, but sometimes choose not to.      General Comments      Exercises Total Joint Exercises Ankle Circles/Pumps: AROM;Strengthening;Both;10 reps;Supine Gluteal Sets: AROM;Strengthening;Both;10 reps;Supine Straight Leg Raises: AROM;Strengthening;Both;10 reps;Supine Long Arc Quad: AROM;Strengthening;Both;10 reps;Seated Knee Flexion: AROM;Strengthening;Both;10 reps;Seated Marching in Standing: AROM;Strengthening;Both;10 reps;Seated Other Exercises Other Exercises: Pt educated on role of PT and services offered during hospital stay.   Assessment/Plan    PT Assessment Patient needs continued PT services  PT Problem List Decreased strength;Decreased range of motion;Decreased activity tolerance;Decreased balance;Decreased mobility;Decreased knowledge of use of DME;Decreased safety awareness       PT Treatment Interventions DME instruction;Gait training;Therapeutic activities;Therapeutic exercise;Balance training;Patient/family education    PT Goals (Current goals can be found in the Care Plan section)  Acute Rehab PT Goals Patient Stated Goal: Get stronger and go home. PT Goal Formulation: With patient Time For Goal Achievement: 06/01/20 Potential to Achieve Goals: Fair    Frequency Min 2X/week   Barriers to discharge Decreased caregiver support Pt has son/daughter, but they are only available intermittently.    Co-evaluation               AM-PAC PT "6 Clicks" Mobility  Outcome Measure Help needed turning from your back to your side while in a flat bed without using bedrails?: A Lot Help needed moving from  lying on your back to sitting on the side of a flat bed without using bedrails?: A Lot Help needed moving to and from a bed to a chair (including a wheelchair)?: Total Help needed standing up from a chair using your arms (e.g., wheelchair or bedside chair)?: Total Help needed to walk in hospital room?: Total Help needed climbing 3-5 steps with a railing? : Total 6 Click Score: 8    End of Session Equipment Utilized During Treatment: Gait belt Activity Tolerance: Patient tolerated treatment well Patient left: in bed;with call bell/phone within reach;with bed alarm set;with SCD's reapplied Nurse Communication: Mobility status PT Visit Diagnosis: Unsteadiness on feet (R26.81);Other abnormalities of gait and mobility (R26.89);Muscle weakness (generalized) (M62.81);History of falling (Z91.81);Difficulty in walking, not elsewhere classified (R26.2)    Time: 9381-0175 PT Time Calculation (min) (ACUTE ONLY): 29 min   Charges:   PT Evaluation $PT Eval Low Complexity: 1 Low PT Treatments $Therapeutic Exercise: 23-37 mins  Nolon Bussing, PT, DPT 05/18/20, 11:05 AM

## 2020-05-18 NOTE — Progress Notes (Signed)
[facility]  - [place] [facility]   PATIENT NAME: Keith Morris    MR#:  [id number]  DATE OF BIRTH:  02/15/1932  SUBJECTIVE:   Patient came in after he had a syncopal episode after mowing his lawn in the hot water yesterday. He had a fall causing tearing of his right arm skin. He is having significant knee pain and right ankle pain. Patient has bruised his right forehead. He feels better today. Eating and drinking well   Son at bedside REVIEW OF SYSTEMS:   Review of Systems  Constitutional: Negative for chills, fever and weight loss.  HENT: Negative for ear discharge, ear pain and nosebleeds.   Eyes: Negative for blurred vision, pain and discharge.  Respiratory: Negative for sputum production, shortness of breath, wheezing and stridor.   Cardiovascular: Negative for chest pain, palpitations, orthopnea and PND.  Gastrointestinal: Negative for abdominal pain, diarrhea, nausea and vomiting.  Genitourinary: Negative for frequency and urgency.  Musculoskeletal: Positive for falls and joint pain. Negative for back pain.  Neurological: Positive for weakness. Negative for sensory change, speech change and focal weakness.  Psychiatric/Behavioral: Negative for depression and hallucinations. The patient is not nervous/anxious.    Tolerating Diet:yes Tolerating PT: recommends rehab  DRUG ALLERGIES:  No Known Allergies  VITALS:  Blood pressure 134/74, pulse 86, temperature 98.2 F (36.8 C), temperature source Oral, resp. rate 19, height 6' (1.829 m), weight 73.2 kg, SpO2 99 %.  PHYSICAL EXAMINATION:   Physical Exam  GENERAL:  86 y.o.-year-old patient lying in the bed with no acute distress.  EYES: Pupils equal, round, reactive to light and accommodation. No scleral icterus.   HEENT: Head atraumatic, normocephalic. Oropharynx and nasopharynx clear. Right forehead bruise +  NECK:  Supple, no jugular venous distention. No thyroid enlargement, no tenderness.  LUNGS:  Normal breath sounds bilaterally, no wheezing, rales, rhonchi. No use of accessory muscles of respiration.  CARDIOVASCULAR: S1, S2 normal. No murmurs, rubs, or gallops.  ABDOMEN: Soft, nontender, nondistended. Bowel sounds present. No organomegaly or mass.  EXTREMITIES: bilateral significant arthritis knee and ankle. Left knee >right knee NEUROLOGIC: Cranial nerves II through XII are intact. No focal Motor or sensory deficits b/l.   PSYCHIATRIC:  patient is alert and oriented x 3.  SKIN: No obvious rash, lesion, or ulcer.  Dry skin+ LABORATORY PANEL:  CBC Recent Labs  Lab 05/18/20 0657  WBC 13.4*  HGB 11.7*  HCT 35.4*  PLT 148*    Chemistries  Recent Labs  Lab 05/18/20 0657  NA 136  K 3.5  CL 106  CO2 18*  GLUCOSE 99  BUN 21  CREATININE 1.14  CALCIUM 8.3*   Cardiac Enzymes No results for input(s): TROPONINI in the last 168 hours. RADIOLOGY:  DG Ankle Complete Right  Result Date: 05/18/2020 CLINICAL DATA:  Soft tissue swelling EXAM: RIGHT ANKLE - COMPLETE 3+ VIEW COMPARISON:  None. FINDINGS: Frontal, oblique, and lateral views were obtained. There is soft tissue swelling. There is no fracture or appreciable joint effusion. There is narrowing in the ankle joint region consistent with a degree of osteoarthritic change. No erosion. There is degenerative change throughout the subtalar joint regions with narrowing severe in the posterior subtalar joint region. Ankle mortise appears grossly intact. There is a small posterior calcaneal spur. Underlying osteoporosis noted. IMPRESSION: Bones osteoporotic. There is osteoarthritic change in the ankle and subtalar joint regions, most severe in the posterior subtalar joint region. Small posterior calcaneal spur. Soft tissue swelling. No evident fracture. Ankle  mortise appears grossly intact. Electronically Signed   By: Bretta Bang III M.D.   On: 05/18/2020 14:11   CT Head Wo Contrast  Result Date: 05/16/2020 CLINICAL DATA:  Head  injury, fall, facial trauma EXAM: CT HEAD WITHOUT CONTRAST CT CERVICAL SPINE WITHOUT CONTRAST TECHNIQUE: Multidetector CT imaging of the head and cervical spine was performed following the standard protocol without intravenous contrast. Multiplanar CT image reconstructions of the cervical spine were also generated. COMPARISON:  None. FINDINGS: CT HEAD FINDINGS Brain: Normal anatomic configuration. Mild to moderate parenchymal volume loss is commensurate with the patient's age. Mild periventricular white matter changes are present likely reflecting the sequela of small vessel ischemia. No abnormal intra or extra-axial mass lesion or fluid collection. No abnormal mass effect or midline shift. No evidence of acute intracranial hemorrhage or infarct. Ventricular size is normal. Cerebellum unremarkable. Vascular: Moderate atherosclerotic calcification is seen within the carotid siphons bilaterally. No asymmetric hyperdense vasculature noted at the skull base. Skull: Intact Sinuses/Orbits: Paranasal sinuses are clear. Orbits are unremarkable. Other: Mastoid air cells and middle ear cavities are clear. Small scalp hematoma seen within the right supraorbital region. CT CERVICAL SPINE FINDINGS Alignment: Normal cervical lordosis. No listhesis of the cervical spine. Skull base and vertebrae: The craniocervical junction is unremarkable. The atlantodental interval is normal. Vertebral body height has been preserved. No acute fracture of the cervical spine. No lytic or blastic bone lesion. Soft tissues and spinal canal: Moderate atherosclerotic calcifications seen within the carotid bifurcations bilaterally. No prevertebral soft tissue swelling or paraspinal fluid collections identified. No visible canal hematoma. Disc levels: There is intervertebral disc space narrowing and endplate remodeling of C3-T2 in keeping with changes of diffuse moderate to severe degenerative disc disease. There is degenerative ankylosis of the  vertebral bodies of C4 and C5. Review of the axial images demonstrates: C1-2: Moderate atlantodental degenerative arthritis. C2-3: Severe left facet arthrosis results in moderate left neural foraminal narrowing. No significant canal stenosis. C3-4: Severe bilateral uncovertebral arthrosis, mild left facet arthrosis, and severe right facet arthrosis results in severe right neural foraminal narrowing and moderate left neural foraminal narrowing. Mild effacement of the anterior canal space. No significant canal stenosis. C4-5: Moderate bilateral uncovertebral arthrosis and mild right and moderate left facet arthrosis results in severe left and moderate to severe right neural foraminal narrowing. Effacement of the a anterior canal space without significant canal stenosis. C5-6: Mild osteophytic ridging and mild bilateral facet arthrosis results in mild left and mild-to-moderate right neural foraminal narrowing. No significant canal stenosis. C6-7: Moderate bilateral uncovertebral arthrosis and mild facet arthrosis results in moderate bilateral neural foraminal narrowing. C7-T1: Moderate bilateral facet arthrosis. No significant neural foraminal narrowing. No canal stenosis. Upper chest: The visualized lung apices are unremarkable. Other: None significant IMPRESSION: 1. Small right supraorbital scalp hematoma. 2. No acute intracranial abnormality. 3. No acute fracture or subluxation of the cervical spine. 4. Moderate to severe multilevel degenerative disc disease and facet arthrosis of the cervical spine, most prominent at C3-T2. Electronically Signed   By: Helyn Numbers MD   On: 05/16/2020 21:19   CT Cervical Spine Wo Contrast  Result Date: 05/16/2020 CLINICAL DATA:  Head injury, fall, facial trauma EXAM: CT HEAD WITHOUT CONTRAST CT CERVICAL SPINE WITHOUT CONTRAST TECHNIQUE: Multidetector CT imaging of the head and cervical spine was performed following the standard protocol without intravenous contrast.  Multiplanar CT image reconstructions of the cervical spine were also generated. COMPARISON:  None. FINDINGS: CT HEAD FINDINGS Brain: Normal anatomic configuration. Mild  to moderate parenchymal volume loss is commensurate with the patient's age. Mild periventricular white matter changes are present likely reflecting the sequela of small vessel ischemia. No abnormal intra or extra-axial mass lesion or fluid collection. No abnormal mass effect or midline shift. No evidence of acute intracranial hemorrhage or infarct. Ventricular size is normal. Cerebellum unremarkable. Vascular: Moderate atherosclerotic calcification is seen within the carotid siphons bilaterally. No asymmetric hyperdense vasculature noted at the skull base. Skull: Intact Sinuses/Orbits: Paranasal sinuses are clear. Orbits are unremarkable. Other: Mastoid air cells and middle ear cavities are clear. Small scalp hematoma seen within the right supraorbital region. CT CERVICAL SPINE FINDINGS Alignment: Normal cervical lordosis. No listhesis of the cervical spine. Skull base and vertebrae: The craniocervical junction is unremarkable. The atlantodental interval is normal. Vertebral body height has been preserved. No acute fracture of the cervical spine. No lytic or blastic bone lesion. Soft tissues and spinal canal: Moderate atherosclerotic calcifications seen within the carotid bifurcations bilaterally. No prevertebral soft tissue swelling or paraspinal fluid collections identified. No visible canal hematoma. Disc levels: There is intervertebral disc space narrowing and endplate remodeling of C3-T2 in keeping with changes of diffuse moderate to severe degenerative disc disease. There is degenerative ankylosis of the vertebral bodies of C4 and C5. Review of the axial images demonstrates: C1-2: Moderate atlantodental degenerative arthritis. C2-3: Severe left facet arthrosis results in moderate left neural foraminal narrowing. No significant canal stenosis.  C3-4: Severe bilateral uncovertebral arthrosis, mild left facet arthrosis, and severe right facet arthrosis results in severe right neural foraminal narrowing and moderate left neural foraminal narrowing. Mild effacement of the anterior canal space. No significant canal stenosis. C4-5: Moderate bilateral uncovertebral arthrosis and mild right and moderate left facet arthrosis results in severe left and moderate to severe right neural foraminal narrowing. Effacement of the a anterior canal space without significant canal stenosis. C5-6: Mild osteophytic ridging and mild bilateral facet arthrosis results in mild left and mild-to-moderate right neural foraminal narrowing. No significant canal stenosis. C6-7: Moderate bilateral uncovertebral arthrosis and mild facet arthrosis results in moderate bilateral neural foraminal narrowing. C7-T1: Moderate bilateral facet arthrosis. No significant neural foraminal narrowing. No canal stenosis. Upper chest: The visualized lung apices are unremarkable. Other: None significant IMPRESSION: 1. Small right supraorbital scalp hematoma. 2. No acute intracranial abnormality. 3. No acute fracture or subluxation of the cervical spine. 4. Moderate to severe multilevel degenerative disc disease and facet arthrosis of the cervical spine, most prominent at C3-T2. Electronically Signed   By: Helyn Numbers MD   On: 05/16/2020 21:19   MR BRAIN WO CONTRAST  Result Date: 05/17/2020 CLINICAL DATA:  Syncope, head injury EXAM: MRI HEAD WITHOUT CONTRAST TECHNIQUE: Multiplanar, multiecho pulse sequences of the brain and surrounding structures were obtained without intravenous contrast. COMPARISON:  Correlation made with 05/16/2020 head CT FINDINGS: Brain: There is no acute infarction or intracranial hemorrhage. There is no intracranial mass, mass effect, or edema. There is no hydrocephalus or extra-axial fluid collection. Prominence of the ventricles and sulci reflects moderate generalized  parenchymal volume loss. Patchy and confluent areas of T2 hyperintensity in the supratentorial white matter are nonspecific but may reflect moderate chronic microvascular ischemic changes. Vascular: Major vessel flow voids at the skull base are preserved. Skull and upper cervical spine: Normal marrow signal is preserved. Sinuses/Orbits: Paranasal sinuses are aerated. Orbits are unremarkable. Other: Sella is unremarkable.  Mastoid air cells are clear. IMPRESSION: No evidence of recent infarction, hemorrhage, or mass. Moderate chronic microvascular ischemic changes. Electronically Signed  By: Guadlupe Spanish M.D.   On: 05/17/2020 14:53   DG Knee Complete 4 Views Left  Result Date: 05/17/2020 CLINICAL DATA:  Fall yesterday. EXAM: LEFT KNEE - COMPLETE 4+ VIEW COMPARISON:  Right knee radiographs 05/17/2020 FINDINGS: Advanced tricompartmental degenerative changes are noted in the knee. A moderate to large effusion is present. No discrete fracture is evident. Vascular calcifications are noted. IMPRESSION: 1. Moderate to large effusion. 2. No discrete fracture.  Ligamentous injury is not excluded. 3. Advanced tricompartmental degenerative changes. Electronically Signed   By: Marin Roberts M.D.   On: 05/17/2020 06:44   DG Knee Complete 4 Views Right  Result Date: 05/17/2020 CLINICAL DATA:  Bilateral knee pain.  Fall yesterday. EXAM: RIGHT KNEE - COMPLETE 4+ VIEW COMPARISON:  Left knee radiographs 05/17/2020 FINDINGS: Advanced degenerative changes are present with asymmetric joint space loss laterally. Patellofemoral disease is present. Small effusion is noted. No acute or healing fractures are present. Vascular calcifications are present. IMPRESSION: 1. Small effusion. 2. No acute fractures. 3. Advanced tricompartmental degenerative changes. 4. Atherosclerosis. Electronically Signed   By: Marin Roberts M.D.   On: 05/17/2020 06:41   ECHOCARDIOGRAM COMPLETE  Result Date: 05/18/2020    ECHOCARDIOGRAM  REPORT   Patient Name:   Keith Morris Date of Exam: 05/18/2020 Medical Rec #:  [id number]   Height:       72.0 in Accession #:    [id number]  Weight:       161.4 lb Date of Birth:  Dec 16, 1931    BSA:          1.945 m Patient Age:    86 years    BP:           145/83 mmHg Patient Gender: M           HR:           76 bpm. Exam Location:  [facility] Procedure: 2D Echo, Color Doppler and Cardiac Doppler Indications:     R55 Syncope  History:         Patient has no prior history of Echocardiogram examinations.                  Risk Factors:Current Smoker.  Sonographer:     Humphrey Rolls RDCS (AE) Referring Phys:  Kern Reap Lorretta Harp Diagnosing Phys: Julien Nordmann MD  Sonographer Comments: Technically difficult study due to poor echo windows. TDS due to inability of pt to turn LLD. IMPRESSIONS  1. Left ventricular ejection fraction, by estimation, is 60 to 65%. The left ventricle has normal function. The left ventricle has no regional wall motion abnormalities. Left ventricular diastolic parameters are consistent with Grade I diastolic dysfunction (impaired relaxation).  2. Right ventricular systolic function is normal. The right ventricular size is normal. Tricuspid regurgitation signal is inadequate for assessing PA pressure.  3. Challeng ing images. FINDINGS  Left Ventricle: Left ventricular ejection fraction, by estimation, is 60 to 65%. The left ventricle has normal function. The left ventricle has no regional wall motion abnormalities. The left ventricular internal cavity size was normal in size. There is  no left ventricular hypertrophy. Left ventricular diastolic parameters are consistent with Grade I diastolic dysfunction (impaired relaxation). Right Ventricle: The right ventricular size is normal. No increase in right ventricular wall thickness. Right ventricular systolic function is normal. Tricuspid regurgitation signal is inadequate for assessing PA pressure. Left Atrium: Left atrial size was normal in size. Right Atrium:  Right atrial size was normal in size. Pericardium: There is  no evidence of pericardial effusion. Mitral Valve: The mitral valve was not well visualized. Normal mobility of the mitral valve leaflets. No evidence of mitral valve regurgitation. No evidence of mitral valve stenosis. MV peak gradient, 3.5 mmHg. The mean mitral valve gradient is 2.0 mmHg. Tricuspid Valve: The tricuspid valve is not well visualized. Tricuspid valve regurgitation is not demonstrated. No evidence of tricuspid stenosis. Aortic Valve: The aortic valve was not well visualized. Aortic valve regurgitation is not visualized. No aortic stenosis is present. Aortic valve mean gradient measures 4.0 mmHg. Aortic valve peak gradient measures 6.7 mmHg. Aortic valve area, by VTI measures 1.61 cm. Pulmonic Valve: The pulmonic valve was not well visualized. Pulmonic valve regurgitation is not visualized. No evidence of pulmonic stenosis. Aorta: The aortic root is normal in size and structure. Venous: The inferior vena cava is normal in size with greater than 50% respiratory variability, suggesting right atrial pressure of 3 mmHg. IAS/Shunts: No atrial level shunt detected by color flow Doppler.  LEFT VENTRICLE PLAX 2D LVOT diam:     1.80 cm     Diastology LV SV:         42          LV e' lateral:   5.44 cm/s LV SV Index:   21          LV E/e' lateral: 17.9 LVOT Area:     2.54 cm    LV e' medial:    6.53 cm/s                            LV E/e' medial:  14.9  LV Volumes (MOD) LV vol d, MOD A2C: 51.1 ml LV vol d, MOD A4C: 62.5 ml LV vol s, MOD A2C: 15.3 ml LV vol s, MOD A4C: 19.3 ml LV SV MOD A2C:     35.8 ml LV SV MOD A4C:     62.5 ml LV SV MOD BP:      39.1 ml LEFT ATRIUM           Index LA Vol (A2C): 11.3 ml 5.81 ml/m LA Vol (A4C): 41.1 ml 21.13 ml/m  AORTIC VALVE AV Area (Vmax):    1.58 cm AV Area (Vmean):   1.44 cm AV Area (VTI):     1.61 cm AV Vmax:           129.00 cm/s AV Vmean:          95.000 cm/s AV VTI:            0.260 m AV Peak Grad:       6.7 mmHg AV Mean Grad:      4.0 mmHg LVOT Vmax:         80.30 cm/s LVOT Vmean:        53.800 cm/s LVOT VTI:          0.164 m LVOT/AV VTI ratio: 0.63  AORTA Ao Root diam: 2.90 cm MITRAL VALVE MV Area (PHT): 3.40 cm     SHUNTS MV Peak grad:  3.5 mmHg     Systemic VTI:  0.16 m MV Mean grad:  2.0 mmHg     Systemic Diam: 1.80 cm MV Vmax:       0.93 m/s MV Vmean:      64.4 cm/s MV Decel Time: 223 msec MV E velocity: 97.30 cm/s MV A velocity: 104.00 cm/s MV E/A ratio:  0.94 Julien Nordmann MD Electronically signed by Julien Nordmann  MD Signature Date/Time: 05/18/2020/1:47:30 PM    Final    ASSESSMENT AND PLAN:   Keith ClarityBilly Schellhorn is a 86 y.o. male with medical history significant of tobacco abuse, who presents with syncope. Per her daughter, patient had 2 episodes of syncope.  First episode happened while patient was mowing the lawn at about 2:30 PM yesterday.  Patient states that he lost control of both legs.  No seizure activity.  Patient fell and hit his head.  Patient has a skin tear in the right forearm.  He also has hematoma in the right forehead. Pt denies any CP or sob  Syncope and fall: Etiology is not clear. The differential diagnosis is broad, including vasovagal syncope with mowing lawn in the hot weather and AKI/dehydration with acute mild Rhadomyolysis -Cardiac monitor--NSR--no cp--d/c monitor - MRI-brain --> negative for stroke - 2d echo EF 60-65% - Neuro checks --no neuro deficits - low dose ASA - IVF:1L of NS,  Then 75 cc/h - PT/OT eval  Noted--recommends rehab--Pt wants to consider going home -Wound care consult for right forearm skin tear:Dressing procedure/placement/frequency: cleanse right forearm skin tear with NS.  Apply mupirocin ointment and dry dressing/kerlix/tape. Change daily  AKI (acute kidney injury) [facility](HCC): Creatinine 1.44, BUN 26, likely due to dehydration and mild rhabdomyolysis/Heat exhaustion -IV fluid as above -creat 1.44--1.0 -CK  392--436--535--CK tomorrow -cont  IVF  Bilateral Knee effusion with severe OA on Xray knee -Ortho consult with Dr Joice LoftsPoggi-- ?knee aspiration  Elevated troponin: trop 67 -->190 -->168.  Already trending down.  No chest pain.  Likely due to demand ischemia. - aspirin  Leukocytosis: WBC 15.3.  No source of infection identified.  No fever.  Likely reactive. -Follow-up with CBC--trending down 13.4  Tobacco abuse -Nicotine patch   DVT ppx: SCD Code Status: Full code per her daughter Family Communication:  Yes, patient's  son at bed side Disposition Plan:  HH vs Rehab Consults called:  None Admission status:  progressive unit for    Dispo: The patient is from: Home  Anticipated d/c is to: Home (preferred by pt) PT rec Rehab  Anticipated d/c date is: 1-2day  Patient currently is not medically stable to d/c.     Status is: Inpatient  Remains inpatient appropriate because:Ongoing active pain requiring inpatient pain management IVF for Rhabdomyolysis Ortho consulted for knee effusion TOC consulted for d/c   TOTAL TIME TAKING CARE OF THIS PATIENT: *25 minutes.  >50% time spent on counselling and coordination of care  Note: This dictation was prepared with Dragon dictation along with smaller phrase technology. Any transcriptional errors that result from this process are unintentional.  Enedina FinnerSona Rechelle Niebla M.D    [facility]   CC: Primary care physician; Patient, No Pcp PerPatient ID: Keith Morris, male   DOB: 04/22/1932, 86 y.o.   MRN: [id number]

## 2020-05-18 NOTE — Progress Notes (Signed)
Report called to Marylu Lund, RN and patient transferred to room 218.

## 2020-05-19 DIAGNOSIS — M25462 Effusion, left knee: Secondary | ICD-10-CM

## 2020-05-19 DIAGNOSIS — R778 Other specified abnormalities of plasma proteins: Secondary | ICD-10-CM

## 2020-05-19 DIAGNOSIS — T671XXA Heat syncope, initial encounter: Secondary | ICD-10-CM

## 2020-05-19 LAB — CK: Total CK: 306 U/L (ref 49–397)

## 2020-05-19 NOTE — Progress Notes (Signed)
PROGRESS NOTE    Keith Morris  PQA:[id number] DOB: Feb 06, 1932 DOA: 05/17/2020 PCP: Patient, No Pcp Per   Chief complaint.  Syncope.  Brief Narrative: Keith Morris a 86 y.o.malewith medical history significant oftobacco abuse, who presents with syncope. Per her daughter, patient had 2 episodes of syncope. First episode happened while patient was mowing the lawn at about 2:30 PM yesterday. Patient states that he lost control ofboth legs.No seizure activity. Patient fell and hit his head. Patient has askin tear in the right forearm. He also has hematoma in the right forehead. Pt denies any CP or sob. Work-up for syncope negative including telemetry did not show any arrhythmia.  MRI brain negative for stroke.  Echocardiogram showed ejection fraction 60 to 65%, no significant valvular disease.  Patient has mild rhabdomyolysis and acute kidney injury suggest dehydration.  His condition appears to be secondary to vasovagal reaction. Patient was giving IV fluids, renal function has normalized.  CK level has normalized.  Fluids discontinued on 8/14. Patient also has bilateral knee pain, orthopedic consult was obtained, patient had a knee tap on the left side with the injection.    8/14. Planning for right knee injection by orthopedics.  Continue IV fluids.  Continue PT OT.   Assessment & Plan:   Principal Problem:   Syncope Active Problems:   Fall   AKI (acute kidney injury) ([place])   Elevated troponin   Elevated CK   Leukocytosis   Tobacco abuse   Rhabdomyolysis   Dehydration   Skin tear of right elbow without complication   Knee effusion, left   #1.  Syncope and collapse. Appear to be secondary to vasovagal reaction with dehydration.  Condition had improved.  Renal function has normalized.  2.  Acute kidney injury with mild rhabdomyolysis. Condition had improved.  3.  Bilateral knee effusion with a severe osteoarthritis. Had a left knee aspiration and injection.   Planning for right knee injection.  4.  Mild elevated troponin. Likely secondary to demand ischemia due to dehydration and acute renal failure.  No additional work-up is needed.  5.  Generalized weakness. Patient has been evaluated by PT/OT, he refused nursing home placement.  Continue current PT OT.  Pending joint injection, hopefully will improve patient mobility after treatment.   DVT prophylaxis: SCDs Code Status: Full Family Communication: None Disposition Plan:  . Patient came from: Home            . Anticipated d/c place: Home . Barriers to d/c OR conditions which need to be met to effect a safe d/c:   Consultants:   Orthopedic surgeon  Procedures: Knee aspiration and injection Antimicrobials:  None  Subjective: Patient still has significant weakness, able to walk a few steps with physical therapy. Denies any short of breath or cough. No nausea vomiting abdominal pain.  Objective: Vitals:   05/18/20 0729 05/18/20 1148 05/18/20 1928 05/19/20 0435  BP: 114/87 134/74 (!) 112/50 116/64  Pulse: 72 86 74 69  Resp: _0 Temp: 98.3 F (36.8 C) 98.2 F (36.8 C) 98.5 F (36.9 C) 97.7 F (36.5 C)  TempSrc: Oral Oral Oral Oral  SpO2: 98% 99% 100% 98%  Weight:      Height:        Intake/Output Summary (Last 24 hours) at 05/19/2020 1203 Last data filed at 05/19/2020 0900 Gross per 24 hour  Intake 1305 ml  Output 900 ml  Net 405 ml   Filed Weights   05/17/20 0504 05/17/20 1715  Weight: 76.2 kg 73.2 kg    Examination:  General exam: Appears calm and comfortable  Respiratory system: Clear to auscultation. Respiratory effort normal. Cardiovascular system: S1 & S2 heard, RRR. No JVD, murmurs, rubs, gallops or clicks. No pedal edema. Gastrointestinal system: Abdomen is nondistended, soft and nontender. No organomegaly or masses felt. Normal bowel sounds heard. Central nervous system: Alert and oriented. No focal neurological deficits. Extremities:  Symmetric 5 x 5 power. Skin: No rashes, lesions or ulcers Psychiatry: Judgement and insight appear normal. Mood & affect appropriate.     Data Reviewed: I have personally reviewed following labs and imaging studies  CBC: Recent Labs  Lab 05/16/20 2020 05/18/20 0657  WBC 15.3* 13.4*  HGB 12.7* 11.7*  HCT 37.8* 35.4*  MCV 96.9 98.6  PLT 190 539*   Basic Metabolic Panel: Recent Labs  Lab 05/16/20 2020 05/17/20 0810 05/18/20 0657  NA 137 137 136  K 4.8 4.2 3.5  CL 105 102 106  CO2 21* 22 18*  GLUCOSE 103* 110* 99  BUN 26* 26* 21  CREATININE 1.44* 1.30* 1.14  CALCIUM 9.2 9.1 8.3*   GFR: Estimated Creatinine Clearance: 48.2 mL/min (by C-G formula based on SCr of 1.14 mg/dL). Liver Function Tests: No results for input(s): AST, ALT, ALKPHOS, BILITOT, PROT, ALBUMIN in the last 168 hours. No results for input(s): LIPASE, AMYLASE in the last 168 hours. No results for input(s): AMMONIA in the last 168 hours. Coagulation Profile: No results for input(s): INR, PROTIME in the last 168 hours. Cardiac Enzymes: Recent Labs  Lab 05/17/20 0505 05/17/20 0810 05/18/20 0657 05/19/20 0633  CKTOTAL 392 436* 535* 306   BNP (last 3 results) No results for input(s): PROBNP in the last 8760 hours. HbA1C: Recent Labs    05/18/20 0657  HGBA1C 5.1   CBG: No results for input(s): GLUCAP in the last 168 hours. Lipid Profile: Recent Labs    05/17/20 0810  CHOL 192  HDL 35*  LDLCALC 121*  TRIG 180*  CHOLHDL 5.5   Thyroid Function Tests: No results for input(s): TSH, T4TOTAL, FREET4, T3FREE, THYROIDAB in the last 72 hours. Anemia Panel: No results for input(s): VITAMINB12, FOLATE, FERRITIN, TIBC, IRON, RETICCTPCT in the last 72 hours. Sepsis Labs: No results for input(s): PROCALCITON, LATICACIDVEN in the last 168 hours.  Recent Results (from the past 240 hour(s))  SARS Coronavirus 2 by RT PCR (hospital order, performed in [facility] hospital lab) Nasopharyngeal  Nasopharyngeal Swab     Status: None   Collection Time: 05/17/20  8:10 AM   Specimen: Nasopharyngeal Swab  Result Value Ref Range Status   SARS Coronavirus 2 NEGATIVE NEGATIVE Final    Comment: (NOTE) SARS-CoV-2 target nucleic acids are NOT DETECTED.  The SARS-CoV-2 RNA is generally detectable in upper and lower respiratory specimens during the acute phase of infection. The lowest concentration of SARS-CoV-2 viral copies this assay can detect is 250 copies / mL. A negative result does not preclude SARS-CoV-2 infection and should not be used as the sole basis for treatment or other patient management decisions.  A negative result may occur with improper specimen collection / handling, submission of specimen other than nasopharyngeal swab, presence of viral mutation(s) within the areas targeted by this assay, and inadequate number of viral copies (<250 copies / mL). A negative result must be combined with clinical observations, patient history, and epidemiological information.  Fact Sheet for Patients:   StrictlyIdeas.no  Fact Sheet for Healthcare Providers: BankingDealers.co.za  This test is not yet  approved or  cleared by the [place] and has been authorized for detection and/or diagnosis of SARS-CoV-2 by FDA under an Emergency Use Authorization (EUA).  This EUA will remain in effect (meaning this test can be used) for the duration of the COVID-19 declaration under Section 564(b)(1) of the Act, 21 U.S.C. section 360bbb-3(b)(1), unless the authorization is terminated or revoked sooner.  Performed at [facility], [address] [place], [place] [address]          Radiology Studies: DG Ankle Complete Right  Result Date: 05/18/2020 CLINICAL DATA:  Soft tissue swelling EXAM: RIGHT ANKLE - COMPLETE 3+ VIEW COMPARISON:  None. FINDINGS: Frontal, oblique, and lateral views were obtained. There is soft tissue  swelling. There is no fracture or appreciable joint effusion. There is narrowing in the ankle joint region consistent with a degree of osteoarthritic change. No erosion. There is degenerative change throughout the subtalar joint regions with narrowing severe in the posterior subtalar joint region. Ankle mortise appears grossly intact. There is a small posterior calcaneal spur. Underlying osteoporosis noted. IMPRESSION: Bones osteoporotic. There is osteoarthritic change in the ankle and subtalar joint regions, most severe in the posterior subtalar joint region. Small posterior calcaneal spur. Soft tissue swelling. No evident fracture. Ankle mortise appears grossly intact. Electronically Signed   By: Lowella Grip III M.D.   On: 05/18/2020 14:11   MR BRAIN WO CONTRAST  Result Date: 05/17/2020 CLINICAL DATA:  Syncope, head injury EXAM: MRI HEAD WITHOUT CONTRAST TECHNIQUE: Multiplanar, multiecho pulse sequences of the brain and surrounding structures were obtained without intravenous contrast. COMPARISON:  Correlation made with 05/16/2020 head CT FINDINGS: Brain: There is no acute infarction or intracranial hemorrhage. There is no intracranial mass, mass effect, or edema. There is no hydrocephalus or extra-axial fluid collection. Prominence of the ventricles and sulci reflects moderate generalized parenchymal volume loss. Patchy and confluent areas of T2 hyperintensity in the supratentorial white matter are nonspecific but may reflect moderate chronic microvascular ischemic changes. Vascular: Major vessel flow voids at the skull base are preserved. Skull and upper cervical spine: Normal marrow signal is preserved. Sinuses/Orbits: Paranasal sinuses are aerated. Orbits are unremarkable. Other: Sella is unremarkable.  Mastoid air cells are clear. IMPRESSION: No evidence of recent infarction, hemorrhage, or mass. Moderate chronic microvascular ischemic changes. Electronically Signed   By: Macy Mis M.D.   On:  05/17/2020 14:53   ECHOCARDIOGRAM COMPLETE  Result Date: 05/18/2020    ECHOCARDIOGRAM REPORT   Patient Name:   Keith Morris Date of Exam: 05/18/2020 Medical Rec #:  [id number]   Height:       72.0 in Accession #:    [id number]  Weight:       161.4 lb Date of Birth:  March 17, 1932    BSA:          1.945 m Patient Age:    63 years    BP:           145/83 mmHg Patient Gender: M           HR:           76 bpm. Exam Location:  [facility] Procedure: 2D Echo, Color Doppler and Cardiac Doppler Indications:     R55 Syncope  History:         Patient has no prior history of Echocardiogram examinations.                  Risk Factors:Current Smoker.  Sonographer:  Charmayne Sheer RDCS (AE) Referring Phys:  Baker Janus Soledad Gerlach NIU Diagnosing Phys: Ida Rogue MD  Sonographer Comments: Technically difficult study due to poor echo windows. TDS due to inability of pt to turn LLD. IMPRESSIONS  1. Left ventricular ejection fraction, by estimation, is 60 to 65%. The left ventricle has normal function. The left ventricle has no regional wall motion abnormalities. Left ventricular diastolic parameters are consistent with Grade I diastolic dysfunction (impaired relaxation).  2. Right ventricular systolic function is normal. The right ventricular size is normal. Tricuspid regurgitation signal is inadequate for assessing PA pressure.  3. Challeng ing images. FINDINGS  Left Ventricle: Left ventricular ejection fraction, by estimation, is 60 to 65%. The left ventricle has normal function. The left ventricle has no regional wall motion abnormalities. The left ventricular internal cavity size was normal in size. There is  no left ventricular hypertrophy. Left ventricular diastolic parameters are consistent with Grade I diastolic dysfunction (impaired relaxation). Right Ventricle: The right ventricular size is normal. No increase in right ventricular wall thickness. Right ventricular systolic function is normal. Tricuspid regurgitation signal is inadequate for  assessing PA pressure. Left Atrium: Left atrial size was normal in size. Right Atrium: Right atrial size was normal in size. Pericardium: There is no evidence of pericardial effusion. Mitral Valve: The mitral valve was not well visualized. Normal mobility of the mitral valve leaflets. No evidence of mitral valve regurgitation. No evidence of mitral valve stenosis. MV peak gradient, 3.5 mmHg. The mean mitral valve gradient is 2.0 mmHg. Tricuspid Valve: The tricuspid valve is not well visualized. Tricuspid valve regurgitation is not demonstrated. No evidence of tricuspid stenosis. Aortic Valve: The aortic valve was not well visualized. Aortic valve regurgitation is not visualized. No aortic stenosis is present. Aortic valve mean gradient measures 4.0 mmHg. Aortic valve peak gradient measures 6.7 mmHg. Aortic valve area, by VTI measures 1.61 cm. Pulmonic Valve: The pulmonic valve was not well visualized. Pulmonic valve regurgitation is not visualized. No evidence of pulmonic stenosis. Aorta: The aortic root is normal in size and structure. Venous: The inferior vena cava is normal in size with greater than 50% respiratory variability, suggesting right atrial pressure of 3 mmHg. IAS/Shunts: No atrial level shunt detected by color flow Doppler.  LEFT VENTRICLE PLAX 2D LVOT diam:     1.80 cm     Diastology LV SV:         42          LV e' lateral:   5.44 cm/s LV SV Index:   21          LV E/e' lateral: 17.9 LVOT Area:     2.54 cm    LV e' medial:    6.53 cm/s                            LV E/e' medial:  14.9  LV Volumes (MOD) LV vol d, MOD A2C: 51.1 ml LV vol d, MOD A4C: 62.5 ml LV vol s, MOD A2C: 15.3 ml LV vol s, MOD A4C: 19.3 ml LV SV MOD A2C:     35.8 ml LV SV MOD A4C:     62.5 ml LV SV MOD BP:      39.1 ml LEFT ATRIUM           Index LA Vol (A2C): 11.3 ml 5.81 ml/m LA Vol (A4C): 41.1 ml 21.13 ml/m  AORTIC VALVE AV Area (Vmax):    1.58  cm AV Area (Vmean):   1.44 cm AV Area (VTI):     1.61 cm AV Vmax:            129.00 cm/s AV Vmean:          95.000 cm/s AV VTI:            0.260 m AV Peak Grad:      6.7 mmHg AV Mean Grad:      4.0 mmHg LVOT Vmax:         80.30 cm/s LVOT Vmean:        53.800 cm/s LVOT VTI:          0.164 m LVOT/AV VTI ratio: 0.63  AORTA Ao Root diam: 2.90 cm MITRAL VALVE MV Area (PHT): 3.40 cm     SHUNTS MV Peak grad:  3.5 mmHg     Systemic VTI:  0.16 m MV Mean grad:  2.0 mmHg     Systemic Diam: 1.80 cm MV Vmax:       0.93 m/s MV Vmean:      64.4 cm/s MV Decel Time: 223 msec MV E velocity: 97.30 cm/s MV A velocity: 104.00 cm/s MV E/A ratio:  0.94 Ida Rogue MD Electronically signed by Ida Rogue MD Signature Date/Time: 05/18/2020/1:47:30 PM    Final         Scheduled Meds: . aspirin  81 mg Oral Daily  . mupirocin cream   Topical Daily  . nicotine  21 mg Transdermal Daily   Continuous Infusions:   LOS: 1 day    Time spent: 28 minutes    Sharen Hones, MD [facility]   To contact the attending provider between 7A-7P or the covering provider during after hours 7P-7A, please log into the web site [email or web address] and access using universal [place] password for that web site. If you do not have the password, please call the hospital operator.  05/19/2020, 12:03 PM

## 2020-05-19 NOTE — TOC Initial Note (Signed)
Transition of Care [facility]) - Initial/Assessment Note    Patient Details  Name: Keith Morris MRN: [id number] Date of Birth: 1931/11/20  Transition of [facility]) CM/SW Contact:    Verna Czech [place], [place] Phone Number: 05/19/2020, 12:11 PM  Clinical Narrative:                 Keith Morris is a 86 y.o. male with medical history significant of tobacco abuse, who presents with syncope. Patient's son Keith Morris at bedside. Patient lives alone, however has a supportive son and daughter that check on him regularly. Per patient's son, patient does not have a PCP. He only sees Dr. Geraldo Pitter. Patient's son agrees with the importance of establishing patient with a PCP.  Patient now agreeable to SNF. SNF process discussed with patient and son. PASRR completed, FL2 to be faxed to local facilities for bed offers.    Expected Discharge Plan: Skilled Nursing Facility Barriers to Discharge: No Barriers Identified   Patient Goals and CMS Choice Patient states their goals for this hospitalization and ongoing recovery are:: "To get back to walking" CMS Medicare.gov Compare Post Acute Care list provided to:: Patient Choice offered to / list presented to : Patient, Adult Children  Expected Discharge Plan and Services Expected Discharge Plan: Skilled Nursing Facility In-house Referral: Clinical Social Work   Post Acute Care Choice: Skilled Nursing Facility Living arrangements for the past 2 months: Single Family Home                                      Prior Living Arrangements/Services Living arrangements for the past 2 months: Single Family Home Lives with:: Self Patient language and need for interpreter reviewed:: Yes        Need for Family Participation in Patient Care: Yes (Comment) Care giver support system in place?: Yes (comment) Current home services: DME, Meals on wheels (walker) Criminal Activity/Legal Involvement Pertinent to Current Situation/Hospitalization: No - Comment as  needed  Activities of Daily Living Home Assistive Devices/Equipment: Eyeglasses, Environmental consultant (specify type) ADL Screening (condition at time of admission) Patient's cognitive ability adequate to safely complete daily activities?: Yes Is the patient deaf or have difficulty hearing?: No Does the patient have difficulty seeing, even when wearing glasses/contacts?: No Does the patient have difficulty concentrating, remembering, or making decisions?: No Patient able to express need for assistance with ADLs?: Yes Does the patient have difficulty dressing or bathing?: No Independently performs ADLs?: Yes (appropriate for developmental age) Does the patient have difficulty walking or climbing stairs?: Yes Weakness of Legs: Both Weakness of Arms/Hands: None  Permission Sought/Granted   Permission granted to share information with : Yes, Verbal Permission Granted        Permission granted to share info w Relationship: Keith Morris [phone] son Keith Morris [phone] daughter  Permission granted to share info w Contact Information: Keith Morris [phone] Keith Morris [phone]  Emotional Assessment Appearance:: Appears older than stated age Attitude/Demeanor/Rapport: Engaged, Self-Confident Affect (typically observed): Accepting, Adaptable Orientation: : Oriented to Self, Oriented to Place, Oriented to  Time, Oriented to Situation   Psych Involvement: No (comment)  Admission diagnosis:  Dehydration [E86.0] Weakness [R53.1] Abnormal EKG [R94.31] Elevated troponin [R77.8] AKI (acute kidney injury) ([facility]) [N17.9] Skin tear of right elbow without complication, initial encounter [S51.011A] Rhabdomyolysis [M62.82] Patient Active Problem List   Diagnosis Date Noted  . Rhabdomyolysis 05/18/2020  . Dehydration   . Skin tear of right elbow without complication   .  Knee effusion, left   . Fall 05/17/2020  . AKI (acute kidney injury) ([facility]) 05/17/2020  . Elevated troponin 05/17/2020  . Elevated CK 05/17/2020  .  Leukocytosis 05/17/2020  . Tobacco abuse 05/17/2020  . Syncope 05/17/2020  . Abnormal EKG    PCP:  Patient, No Pcp Per Pharmacy:   [id number] [place], [place] - [address] AT [facility] [address] [place] [address] Phone: [phone] Fax: [phone]     Social Determinants of Health (SDOH) Interventions    Readmission Risk Interventions No flowsheet data found.

## 2020-05-19 NOTE — Progress Notes (Addendum)
Subjective: The patient notes substantial improvement in his left knee symptoms following the aspiration/injection performed yesterday afternoon.  He feels that he can lift his leg more easily, and in fact was able to stand up with a walker on both legs with assistance from physical therapy this morning.  He denies any pain in the right knee this morning as well, and feels that his right ankle pain also is improved.   Objective: Vital signs in last 24 hours: Temp:  [97.7 F (36.5 C)-98.5 F (36.9 C)] 97.7 F (36.5 C) (08/14 0435) Pulse Rate:  [69-74] 69 (08/14 0435) Resp:  [16] 16 (08/14 0435) BP: (112-116)/(50-64) 116/64 (08/14 0435) SpO2:  [98 %-100 %] 98 % (08/14 0435)  Intake/Output from previous day: 08/13 0701 - 08/14 0700 In: 1185 [P.O.:360; I.V.:825] Out: 900 [Urine:900] Intake/Output this shift: Total I/O In: 240 [P.O.:240] Out: -   Recent Labs    05/16/20 2020 05/18/20 0657  HGB 12.7* 11.7*   Recent Labs    05/16/20 2020 05/18/20 0657  WBC 15.3* 13.4*  RBC 3.90* 3.59*  HCT 37.8* 35.4*  PLT 190 148*   Recent Labs    05/17/20 0810 05/18/20 0657  NA 137 136  K 4.2 3.5  CL 102 106  CO2 22 18*  BUN 26* 21  CREATININE 1.30* 1.14  GLUCOSE 110* 99  CALCIUM 9.1 8.3*   No results for input(s): LABPT, INR in the last 72 hours.  Physical Exam: On inspection of the left knee, the swelling appears to be improved as compared to yesterday.  There is still a small effusion but this also is improved as compared to yesterday.  He is able to perform an active straight leg raise, and is able to actively flex and extend his knee from 25 to 80 degrees without any pain.  He is neurovascularly intact to the left lower extremity and foot.  On inspection of the right knee, no swelling, erythema, ecchymosis, abrasions, or other skin abnormalities are identified.  There is at most a trace effusion.  He has no tenderness to palpation over the medial or lateral aspects of the knee.   He is able to perform an active straight leg raise, and can actively range his knee from 20 to 80 degrees without any pain.  He is neurovascularly intact to the right lower extremity and foot.  Assessment: Advanced degenerative joint disease of both knees with left knee hemarthrosis, improved symptomatically.  Plan: The patient is responding well to the left knee injection performed yesterday.  As he does not have any pain in his right knee today, I do not feel that a right knee injection is warranted at this time.    He may be mobilized as symptoms permit, using a walker for balance and support.  I agree with short-term skilled nursing facility placement to help with his rehab in order to optimize his balance and strength prior to returning to his independent environment at home.  Thank you for asking me to participate in the care of this delightful man.  He may follow-up in our office as necessary.   Excell Seltzer Darilyn Storbeck 05/19/2020, 12:32 PM

## 2020-05-19 NOTE — [place] FL2 (Signed)
[place] MEDICAID FL2 LEVEL OF CARE SCREENING TOOL     IDENTIFICATION  Patient Name: Keith Morris Birthdate: 27-Feb-1932 Sex: male Admission Date (Current Location): 05/17/2020  [place] and [place] Number:      Facility and Address:  [facility], [address] [place], [place] [address]      Provider Number:    Attending Physician Name and Address:  Marrion Coy, MD  Relative Name and Phone Number:  Lindsey Hommel [phone]    Current Level of Care: Hospital Recommended Level of Care: Skilled Nursing Facility Prior Approval Number:    Date Approved/Denied:   PASRR Number: [id number]  Discharge Plan: SNF    Current Diagnoses: Patient Active Problem List   Diagnosis Date Noted  . Rhabdomyolysis 05/18/2020  . Dehydration   . Skin tear of right elbow without complication   . Knee effusion, left   . Fall 05/17/2020  . AKI (acute kidney injury) (HCC) 05/17/2020  . Elevated troponin 05/17/2020  . Elevated CK 05/17/2020  . Leukocytosis 05/17/2020  . Tobacco abuse 05/17/2020  . Syncope 05/17/2020  . Abnormal EKG     Orientation RESPIRATION BLADDER Height & Weight     Self, Time, Situation, Place  Normal Continent Weight: 161 lb 6.4 oz (73.2 kg) Height:  6' (182.9 cm)  BEHAVIORAL SYMPTOMS/MOOD NEUROLOGICAL BOWEL NUTRITION STATUS      Continent Diet  AMBULATORY STATUS COMMUNICATION OF NEEDS Skin   Extensive Assist Verbally Bruising, Other (Comment) Nurse, learning disability)                       Personal Care Assistance Level of Assistance  Bathing, Feeding, Dressing Bathing Assistance: Maximum assistance Feeding assistance: Limited assistance Dressing Assistance: Maximum assistance     Functional Limitations Info  Sight, Hearing, Speech Sight Info: Adequate Hearing Info: Adequate Speech Info: Adequate    SPECIAL CARE FACTORS FREQUENCY  PT (By licensed PT), OT (By licensed OT)     PT Frequency: 5x per week OT Frequency: 5x per week             Contractures Contractures Info: Not present    Additional Factors Info  Code Status Code Status Info: full code             Current Medications (05/19/2020):  This is the current hospital active medication list Current Facility-Administered Medications  Medication Dose Route Frequency Provider Last Rate Last Admin  . acetaminophen (TYLENOL) tablet 650 mg  650 mg Oral Q6H PRN Lorretta Harp, MD      . aspirin chewable tablet 81 mg  81 mg Oral Daily Lorretta Harp, MD   81 mg at 05/19/20 1110  . mupirocin cream (BACTROBAN) 2 %   Topical Daily Enedina Finner, MD   Given at 05/19/20 1110  . Muscle Rub CREA   Topical PRN Sharyn Creamer, MD   Given at 05/18/20 1449  . nicotine (NICODERM CQ - dosed in mg/24 hours) patch 21 mg  21 mg Transdermal Daily Lorretta Harp, MD   21 mg at 05/19/20 1110  . ondansetron (ZOFRAN) injection 4 mg  4 mg Intravenous Q8H PRN Lorretta Harp, MD      . traMADol Janean Sark) tablet 50 mg  50 mg Oral Q6H PRN Enedina Finner, MD         Discharge Medications: Please see discharge summary for a list of discharge medications.  Relevant Imaging Results:  Relevant Lab Results:   Additional Information SS# [id number]  Verna Czech [place], [place]

## 2020-05-20 LAB — CBC WITH DIFFERENTIAL/PLATELET
Abs Immature Granulocytes: 0.08 10*3/uL — ABNORMAL HIGH (ref 0.00–0.07)
Basophils Absolute: 0 10*3/uL (ref 0.0–0.1)
Basophils Relative: 0 %
Eosinophils Absolute: 0 10*3/uL (ref 0.0–0.5)
Eosinophils Relative: 0 %
HCT: 28.6 % — ABNORMAL LOW (ref 39.0–52.0)
Hemoglobin: 10.1 g/dL — ABNORMAL LOW (ref 13.0–17.0)
Immature Granulocytes: 1 %
Lymphocytes Relative: 9 %
Lymphs Abs: 0.9 10*3/uL (ref 0.7–4.0)
MCH: 32.5 pg (ref 26.0–34.0)
MCHC: 35.3 g/dL (ref 30.0–36.0)
MCV: 92 fL (ref 80.0–100.0)
Monocytes Absolute: 0.6 10*3/uL (ref 0.1–1.0)
Monocytes Relative: 6 %
Neutro Abs: 8.4 10*3/uL — ABNORMAL HIGH (ref 1.7–7.7)
Neutrophils Relative %: 84 %
Platelets: 172 10*3/uL (ref 150–400)
RBC: 3.11 MIL/uL — ABNORMAL LOW (ref 4.22–5.81)
RDW: 13.5 % (ref 11.5–15.5)
WBC: 10 10*3/uL (ref 4.0–10.5)
nRBC: 0 % (ref 0.0–0.2)

## 2020-05-20 LAB — BASIC METABOLIC PANEL
Anion gap: 9 (ref 5–15)
BUN: 31 mg/dL — ABNORMAL HIGH (ref 8–23)
CO2: 18 mmol/L — ABNORMAL LOW (ref 22–32)
Calcium: 8.4 mg/dL — ABNORMAL LOW (ref 8.9–10.3)
Chloride: 109 mmol/L (ref 98–111)
Creatinine, Ser: 1.07 mg/dL (ref 0.61–1.24)
GFR calc Af Amer: >60 mL/min (ref >60)
GFR calc non Af Amer: >60 mL/min (ref >60)
Glucose, Bld: 126 mg/dL — ABNORMAL HIGH (ref 70–99)
Potassium: 3.4 mmol/L — ABNORMAL LOW (ref 3.5–5.1)
Sodium: 136 mmol/L (ref 135–145)

## 2020-05-20 LAB — MAGNESIUM: Magnesium: 2.3 mg/dL (ref 1.7–2.4)

## 2020-05-20 MED ORDER — POTASSIUM CHLORIDE 20 MEQ PO PACK
40.0000 meq | PACK | Freq: Once | ORAL | Status: AC
  Administered 2020-05-20: 40 meq via ORAL
  Filled 2020-05-20: qty 2

## 2020-05-20 NOTE — Progress Notes (Signed)
PROGRESS NOTE    Keith Morris  LNL:[id number] DOB: 20-Oct-1931 DOA: 05/17/2020 PCP: Patient, No Pcp Per   Chief complaint.  Syncope.  Brief Narrative:  Keith Morris a 86 y.o.malewith medical history significant oftobacco abuse, who presents with syncope. Per her daughter, patient had 2 episodes of syncope. First episode happened while patient was mowing the lawn at about 2:30 PM yesterday. Patient states that he lost control ofboth legs.No seizure activity. Patient fell and hit his head. Patient has askin tear in the right forearm. He also has hematoma in the right forehead. Pt denies any CP or sob. Work-up for syncope negative including telemetry did not show any arrhythmia.  MRI brain negative for stroke.  Echocardiogram showed ejection fraction 60 to 65%, no significant valvular disease.  Patient has mild rhabdomyolysis and acute kidney injury suggest dehydration.  His condition appears to be secondary to vasovagal reaction. Patient was giving IV fluids, renal function has normalized.  CK level has normalized.  Fluids discontinued on 8/14. Patient also has bilateral knee pain, orthopedic consult was obtained, patient had a knee tap on the left side with the injection.    8/14. Planning for right knee injection by orthopedics.  Continue IV fluids.  Continue PT OT. 8/15.  Patient was evaluated by orthopedics again yesterday, no need for right knee injection.  Change his mind, want to go to a SNF, pending insurance approval.   Assessment & Plan:   Principal Problem:   Syncope Active Problems:   Fall   AKI (acute kidney injury) ([place])   Elevated troponin   Elevated CK   Leukocytosis   Tobacco abuse   Rhabdomyolysis   Dehydration   Skin tear of right elbow without complication   Knee effusion, left  #1.  Syncope and collapse. Appears to be secondary to dehydration.  Condition had improved  2.  Acute kidney injury with mild rhabdomyolysis. Condition had improved  3.   Bilateral knee effusion with severe osteoarthritis. S/p left knee injection.  4.  Mild elevation of troponin. Secondary to demand ischemia.  5.  Generalized weakness. Continue physical therapy/Occupational Therapy.   DVT prophylaxis: SCDs Code Status: Full Family Communication: None Disposition Plan:   Patient came from: Home                                                                                                                           Anticipated d/c place: SNF  Barriers to d/c OR conditions which need to be met to effect a safe d/c:   Consultants:   Orthopedic surgeon  Procedures: Knee aspiration and injection Antimicrobials:  None  Subjective: Patient still has significant weakness.  No significant pain in the knees.  No short of breath or cough. No diarrhea constipation.  No nausea vomiting or abdominal pain.  Objective: Vitals:   05/18/20 1928 05/19/20 0435 05/19/20 2249 05/20/20 0508  BP: (!) 112/50 116/64 101/70 130/72  Pulse: 74 69 67 (!) 58  Resp: 16  _0 Temp: 98.5 F (36.9 C) 97.7 F (36.5 C) 98.4 F (36.9 C) 97.9 F (36.6 C)  TempSrc: Oral Oral Oral Oral  SpO2: 100% 98% 99% 99%  Weight:      Height:        Intake/Output Summary (Last 24 hours) at 05/20/2020 1134 Last data filed at 05/20/2020 0900 Gross per 24 hour  Intake 360 ml  Output 1400 ml  Net -1040 ml   Filed Weights   05/17/20 0504 05/17/20 1715  Weight: 76.2 kg 73.2 kg    Examination:  General exam: Appears calm and comfortable  Respiratory system: Clear to auscultation. Respiratory effort normal. Cardiovascular system: S1 & S2 heard, RRR. No JVD, murmurs, rubs, gallops or clicks. No pedal edema. Gastrointestinal system: Abdomen is nondistended, soft and nontender. No organomegaly or masses felt. Normal bowel sounds heard. Central nervous system: Alert and oriented. No focal neurological deficits. Extremities: Symmetric  Skin: No rashes, lesions or  ulcers Psychiatry:  Mood & affect appropriate.     Data Reviewed: I have personally reviewed following labs and imaging studies  CBC: Recent Labs  Lab 05/16/20 2020 05/18/20 0657 05/20/20 0730  WBC 15.3* 13.4* 10.0  NEUTROABS  --   --  8.4*  HGB 12.7* 11.7* 10.1*  HCT 37.8* 35.4* 28.6*  MCV 96.9 98.6 92.0  PLT 190 148* 048   Basic Metabolic Panel: Recent Labs  Lab 05/16/20 2020 05/17/20 0810 05/18/20 0657 05/20/20 0730  NA 137 137 136 136  K 4.8 4.2 3.5 3.4*  CL 105 102 106 109  CO2 21* 22 18* 18*  GLUCOSE 103* 110* 99 126*  BUN 26* 26* 21 31*  CREATININE 1.44* 1.30* 1.14 1.07  CALCIUM 9.2 9.1 8.3* 8.4*  MG  --   --   --  2.3   GFR: Estimated Creatinine Clearance: 51.3 mL/min (by C-G formula based on SCr of 1.07 mg/dL). Liver Function Tests: No results for input(s): AST, ALT, ALKPHOS, BILITOT, PROT, ALBUMIN in the last 168 hours. No results for input(s): LIPASE, AMYLASE in the last 168 hours. No results for input(s): AMMONIA in the last 168 hours. Coagulation Profile: No results for input(s): INR, PROTIME in the last 168 hours. Cardiac Enzymes: Recent Labs  Lab 05/17/20 0505 05/17/20 0810 05/18/20 0657 05/19/20 0633  CKTOTAL 392 436* 535* 306   BNP (last 3 results) No results for input(s): PROBNP in the last 8760 hours. HbA1C: Recent Labs    05/18/20 0657  HGBA1C 5.1   CBG: No results for input(s): GLUCAP in the last 168 hours. Lipid Profile: No results for input(s): CHOL, HDL, LDLCALC, TRIG, CHOLHDL, LDLDIRECT in the last 72 hours. Thyroid Function Tests: No results for input(s): TSH, T4TOTAL, FREET4, T3FREE, THYROIDAB in the last 72 hours. Anemia Panel: No results for input(s): VITAMINB12, FOLATE, FERRITIN, TIBC, IRON, RETICCTPCT in the last 72 hours. Sepsis Labs: No results for input(s): PROCALCITON, LATICACIDVEN in the last 168 hours.  Recent Results (from the past 240 hour(s))  SARS Coronavirus 2 by RT PCR (hospital order, performed in  [facility] hospital lab) Nasopharyngeal Nasopharyngeal Swab     Status: None   Collection Time: 05/17/20  8:10 AM   Specimen: Nasopharyngeal Swab  Result Value Ref Range Status   SARS Coronavirus 2 NEGATIVE NEGATIVE Final    Comment: (NOTE) SARS-CoV-2 target nucleic acids are NOT DETECTED.  The SARS-CoV-2 RNA is generally detectable in upper and lower respiratory specimens during the acute phase of infection. The lowest concentration of  SARS-CoV-2 viral copies this assay can detect is 250 copies / mL. A negative result does not preclude SARS-CoV-2 infection and should not be used as the sole basis for treatment or other patient management decisions.  A negative result may occur with improper specimen collection / handling, submission of specimen other than nasopharyngeal swab, presence of viral mutation(s) within the areas targeted by this assay, and inadequate number of viral copies (<250 copies / mL). A negative result must be combined with clinical observations, patient history, and epidemiological information.  Fact Sheet for Patients:   StrictlyIdeas.no  Fact Sheet for Healthcare Providers: BankingDealers.co.za  This test is not yet approved or  cleared by the [place] FDA and has been authorized for detection and/or diagnosis of SARS-CoV-2 by FDA under an Emergency Use Authorization (EUA).  This EUA will remain in effect (meaning this test can be used) for the duration of the COVID-19 declaration under Section 564(b)(1) of the Act, 21 U.S.C. section 360bbb-3(b)(1), unless the authorization is terminated or revoked sooner.  Performed at [facility], [address] [place], [place] [address]          Radiology Studies: DG Ankle Complete Right  Result Date: 05/18/2020 CLINICAL DATA:  Soft tissue swelling EXAM: RIGHT ANKLE - COMPLETE 3+ VIEW COMPARISON:  None. FINDINGS: Frontal, oblique, and lateral views  were obtained. There is soft tissue swelling. There is no fracture or appreciable joint effusion. There is narrowing in the ankle joint region consistent with a degree of osteoarthritic change. No erosion. There is degenerative change throughout the subtalar joint regions with narrowing severe in the posterior subtalar joint region. Ankle mortise appears grossly intact. There is a small posterior calcaneal spur. Underlying osteoporosis noted. IMPRESSION: Bones osteoporotic. There is osteoarthritic change in the ankle and subtalar joint regions, most severe in the posterior subtalar joint region. Small posterior calcaneal spur. Soft tissue swelling. No evident fracture. Ankle mortise appears grossly intact. Electronically Signed   By: Lowella Grip III M.D.   On: 05/18/2020 14:11        Scheduled Meds:  aspirin  81 mg Oral Daily   mupirocin cream   Topical Daily   nicotine  21 mg Transdermal Daily   potassium chloride  40 mEq Oral Once   Continuous Infusions:   LOS: 2 days    Time spent: 27 minutes    Sharen Hones, MD [facility]   To contact the attending provider between 7A-7P or the covering provider during after hours 7P-7A, please log into the web site [email or web address] and access using universal [place] password for that web site. If you do not have the password, please call the hospital operator.  05/20/2020, 11:34 AM

## 2020-05-20 NOTE — TOC Progression Note (Signed)
Transition of Care [facility]) - Progression Note    Patient Details  Name: Keith Morris MRN: [id number] Date of Birth: 18-Dec-1931  [facility]) CM/SW Contact  Marina Goodell Phone Number: [phone] 05/20/2020, 2:51 PM  Clinical Narrative:     CSW spoke with patient's children Cala Bradford [phone] and Jorja Loa [phone], they chose [facility]  This CSW contacted [name] at [facility] [phone] to begin insurance auth, CSW left voicemail.   Expected Discharge Plan: Skilled Nursing Facility Barriers to Discharge: No Barriers Identified  Expected Discharge Plan and Services Expected Discharge Plan: Skilled Nursing Facility In-house Referral: Clinical Social Work   Post Acute Care Choice: Skilled Nursing Facility Living arrangements for the past 2 months: Single Family Home                                       Social Determinants of Health (SDOH) Interventions    Readmission Risk Interventions No flowsheet data found.

## 2020-05-20 NOTE — TOC Progression Note (Signed)
Transition of Care [facility]) - Progression Note    Patient Details  Name: Keith Morris MRN: [id number] Date of Birth: Feb 10, 1932  Transition [facility] Care [facility]) CM/SW Contact  Marina Goodell Phone Number: [phone] 05/20/2020, 11:44 AM  Clinical Narrative:     CSW called and left voicemail for patient's daughter Keith, Morris [phone], and left voicemail to discuss patient disposition.  Fl2 was sent for SNF placement on 8/14.   Expected Discharge Plan: Skilled Nursing Facility Barriers to Discharge: No Barriers Identified  Expected Discharge Plan and Services Expected Discharge Plan: Skilled Nursing Facility In-house Referral: Clinical Social Work   Post Acute Care Choice: Skilled Nursing Facility Living arrangements for the past 2 months: Single Family Home                                       Social Determinants of Health (SDOH) Interventions    Readmission Risk Interventions No flowsheet data found.

## 2020-05-20 NOTE — TOC Progression Note (Signed)
Transition of Care [facility]) - Progression Note    Patient Details  Name: Keith Morris MRN: [id number] Date of Birth: 14-Jan-1932  Transition of Care [facility]) CM/SW Contact  Marina Goodell Phone Number: [phone] 05/20/2020, 4:43 PM  Clinical Narrative:     Unable to initiate insurance with via [employer] Portal.  CSW, left message at [facility] for call back at [phone] during their business hours.  Expected Discharge Plan: Skilled Nursing Facility Barriers to Discharge: No Barriers Identified  Expected Discharge Plan and Services Expected Discharge Plan: Skilled Nursing Facility In-house Referral: Clinical Social Work   Post Acute Care Choice: Skilled Nursing Facility Living arrangements for the past 2 months: Single Family Home                                       Social Determinants of Health (SDOH) Interventions    Readmission Risk Interventions No flowsheet data found.

## 2020-05-20 NOTE — Progress Notes (Signed)
Subjective: The patient denies any pain in either knee.  He is being mobilized with physical therapy and tolerating things well.  He has no new complaints pertaining to his knees.   Objective: Vital signs in last 24 hours: Temp:  [97.9 F (36.6 C)-98.4 F (36.9 C)] 97.9 F (36.6 C) (08/15 1436) Pulse Rate:  [58-72] 72 (08/15 1436) Resp:  [16-20] 16 (08/15 1436) BP: (101-153)/(70-126) 153/126 (08/15 1436) SpO2:  [99 %-100 %] 100 % (08/15 1436)  Intake/Output from previous day: 08/14 0701 - 08/15 0700 In: 480 [P.O.:480] Out: 1400 [Urine:1400] Intake/Output this shift: Total I/O In: 360 [P.O.:360] Out: -   Recent Labs    05/18/20 0657 05/20/20 0730  HGB 11.7* 10.1*   Recent Labs    05/18/20 0657 05/20/20 0730  WBC 13.4* 10.0  RBC 3.59* 3.11*  HCT 35.4* 28.6*  PLT 148* 172   Recent Labs    05/18/20 0657 05/20/20 0730  NA 136 136  K 3.5 3.4*  CL 106 109  CO2 18* 18*  BUN 21 31*  CREATININE 1.14 1.07  GLUCOSE 99 126*  CALCIUM 8.3* 8.4*   No results for input(s): LABPT, INR in the last 72 hours.  Physical Exam: Orthopedic examination of the left knee is unchanged as compared to yesterday.  He again is able to move his knee freely without pain and is able to perform a straight leg raise without difficulty.  He is neurovascularly intact to the left lower extremity and foot.  Orthopedic examination of the right knee also is unchanged as compared to yesterday.  The is able to move his knee freely without pain and is able to perform a straight leg raise without difficulty.  He is neurovascular intact to the right lower extremity and foot.  Assessment: Advanced degenerative joint disease of both knees with left knee hemarthrosis, all of which are improved symptomatically.  Plan: Overall, the patient is pleased with his symptomatic improvement regarding his knees.  He may continue to be mobilized with physical therapy, weightbearing as tolerated on both legs and using  a walker for balance and support.  Thank you for asking me to participate in the care of this most delightful man.  I will sign off now but if you need further orthopedic input, please contact me.  He may follow-up in our office on an as necessary basis.   Keith Morris 05/20/2020, 3:44 PM

## 2020-05-21 DIAGNOSIS — N179 Acute kidney failure, unspecified: Secondary | ICD-10-CM | POA: Diagnosis not present

## 2020-05-21 DIAGNOSIS — R404 Transient alteration of awareness: Secondary | ICD-10-CM | POA: Diagnosis not present

## 2020-05-21 DIAGNOSIS — R778 Other specified abnormalities of plasma proteins: Secondary | ICD-10-CM | POA: Diagnosis not present

## 2020-05-21 DIAGNOSIS — R2681 Unsteadiness on feet: Secondary | ICD-10-CM | POA: Diagnosis not present

## 2020-05-21 DIAGNOSIS — M6281 Muscle weakness (generalized): Secondary | ICD-10-CM | POA: Diagnosis not present

## 2020-05-21 DIAGNOSIS — Z743 Need for continuous supervision: Secondary | ICD-10-CM | POA: Diagnosis not present

## 2020-05-21 DIAGNOSIS — R2689 Other abnormalities of gait and mobility: Secondary | ICD-10-CM | POA: Diagnosis not present

## 2020-05-21 DIAGNOSIS — R531 Weakness: Secondary | ICD-10-CM | POA: Diagnosis not present

## 2020-05-21 DIAGNOSIS — M179 Osteoarthritis of knee, unspecified: Secondary | ICD-10-CM | POA: Diagnosis not present

## 2020-05-21 DIAGNOSIS — M25462 Effusion, left knee: Secondary | ICD-10-CM | POA: Diagnosis not present

## 2020-05-21 DIAGNOSIS — S50311D Abrasion of right elbow, subsequent encounter: Secondary | ICD-10-CM | POA: Diagnosis not present

## 2020-05-21 DIAGNOSIS — R748 Abnormal levels of other serum enzymes: Secondary | ICD-10-CM | POA: Diagnosis not present

## 2020-05-21 DIAGNOSIS — F17200 Nicotine dependence, unspecified, uncomplicated: Secondary | ICD-10-CM | POA: Diagnosis not present

## 2020-05-21 DIAGNOSIS — S90415S Abrasion, left lesser toe(s), sequela: Secondary | ICD-10-CM | POA: Diagnosis not present

## 2020-05-21 DIAGNOSIS — D649 Anemia, unspecified: Secondary | ICD-10-CM | POA: Diagnosis not present

## 2020-05-21 DIAGNOSIS — Z03818 Encounter for observation for suspected exposure to other biological agents ruled out: Secondary | ICD-10-CM | POA: Diagnosis not present

## 2020-05-21 DIAGNOSIS — T671XXA Heat syncope, initial encounter: Secondary | ICD-10-CM | POA: Diagnosis not present

## 2020-05-21 DIAGNOSIS — R52 Pain, unspecified: Secondary | ICD-10-CM | POA: Diagnosis not present

## 2020-05-21 DIAGNOSIS — D72829 Elevated white blood cell count, unspecified: Secondary | ICD-10-CM | POA: Diagnosis not present

## 2020-05-21 DIAGNOSIS — Z7401 Bed confinement status: Secondary | ICD-10-CM | POA: Diagnosis not present

## 2020-05-21 DIAGNOSIS — M2550 Pain in unspecified joint: Secondary | ICD-10-CM | POA: Diagnosis not present

## 2020-05-21 DIAGNOSIS — R55 Syncope and collapse: Secondary | ICD-10-CM | POA: Diagnosis not present

## 2020-05-21 LAB — BASIC METABOLIC PANEL
Anion gap: 8 (ref 5–15)
BUN: 35 mg/dL — ABNORMAL HIGH (ref 8–23)
CO2: 18 mmol/L — ABNORMAL LOW (ref 22–32)
Calcium: 8.4 mg/dL — ABNORMAL LOW (ref 8.9–10.3)
Chloride: 108 mmol/L (ref 98–111)
Creatinine, Ser: 1.05 mg/dL (ref 0.61–1.24)
GFR calc Af Amer: >60 mL/min (ref >60)
GFR calc non Af Amer: >60 mL/min (ref >60)
Glucose, Bld: 101 mg/dL — ABNORMAL HIGH (ref 70–99)
Potassium: 4.2 mmol/L (ref 3.5–5.1)
Sodium: 134 mmol/L — ABNORMAL LOW (ref 135–145)

## 2020-05-21 MED ORDER — TRAMADOL HCL 50 MG PO TABS: 50.0000 mg | ORAL_TABLET | Freq: Four times a day (QID) | ORAL | 0 refills | Status: DC | PRN

## 2020-05-21 MED ORDER — NICOTINE 21 MG/24HR TD PT24: 21.0000 mg | MEDICATED_PATCH | Freq: Every day | TRANSDERMAL | 0 refills | Status: AC

## 2020-05-21 MED ORDER — MUPIROCIN CALCIUM 2 % EX CREA: TOPICAL_CREAM | Freq: Every day | CUTANEOUS | 0 refills | Status: AC

## 2020-05-21 NOTE — Plan of Care (Signed)
Discharge order received. Patient mental status is at baseline. Vital signs stable . No signs of acute distress. Discharge instructions given to [facility].  Staff verbalized understanding. No other issues noted at this time. Transported via EMS

## 2020-05-21 NOTE — Discharge Instructions (Signed)
wouind care: cleanse right forearm skin tear with NS. Apply mupirocin ointment and dry dressing/kerlix/tape. Change daily

## 2020-05-21 NOTE — Discharge Summary (Signed)
Physician Discharge Summary  Patient ID: Keith Morris MRN: [id number] DOB/AGE: [birth year] 86 y.o.  Admit date: 05/17/2020 Discharge date: 05/21/2020  Admission Diagnoses:  Discharge Diagnoses:  Principal Problem:   Syncope Active Problems:   Fall   AKI (acute kidney injury) (HCC)   Elevated troponin   Elevated CK   Leukocytosis   Tobacco abuse   Rhabdomyolysis   Dehydration   Skin tear of right elbow without complication   Knee effusion, left   Discharged Condition: good  Hospital Course:  Keith Morris a 86 y.o.malewith medical history significant oftobacco abuse, who presents with syncope. Per her daughter, patient had 2 episodes of syncope. First episode happened while patient was mowing the lawn at about 2:30 PM yesterday. Patient states that he lost control ofboth legs.No seizure activity. Patient fell and hit his head. Patient has askin tear in the right forearm. He also has hematoma in the right forehead. Pt denies any CP or sob. Work-up for syncope negative including telemetry did not show any arrhythmia. MRI brain negative for stroke. Echocardiogram showed ejection fraction 60 to 65%, no significant valvular disease. Patient has mild rhabdomyolysis and acute kidney injury suggest dehydration. His condition appears to be secondary to vasovagal reaction. Patient was giving IV fluids, renal function has normalized. CK level has normalized. Fluids discontinued on 8/14. Patient also has bilateral knee pain, orthopedic consult was obtained, patient had a knee tap on the left side with the injection.  8/14.Planning for right knee injection by orthopedics. Continue IV fluids. Continue PT OT. 8/15.  Patient was evaluated by orthopedics again yesterday, no need for right knee injection.  Change his mind, want to go to a SNF, pending insurance approval.  #1.  Syncope and collapse. Appears to be secondary to dehydration.  Condition had improved  2.  Acute  kidney injury with mild rhabdomyolysis. Condition had improved  3.  Bilateral knee effusion with severe osteoarthritis. S/p left knee injection.  4.  Mild elevation of troponin. Secondary to demand ischemia.  5.  Generalized weakness. Continue physical therapy/Occupational Therapy.     Consults: orthopedic surgery  Significant Diagnostic Studies:  MRI HEAD WITHOUT CONTRAST  TECHNIQUE: Multiplanar, multiecho pulse sequences of the brain and surrounding structures were obtained without intravenous contrast.  COMPARISON:  Correlation made with 05/16/2020 head CT  FINDINGS: Brain: There is no acute infarction or intracranial hemorrhage. There is no intracranial mass, mass effect, or edema. There is no hydrocephalus or extra-axial fluid collection. Prominence of the ventricles and sulci reflects moderate generalized parenchymal volume loss. Patchy and confluent areas of T2 hyperintensity in the supratentorial white matter are nonspecific but may reflect moderate chronic microvascular ischemic changes.  Vascular: Major vessel flow voids at the skull base are preserved.  Skull and upper cervical spine: Normal marrow signal is preserved.  Sinuses/Orbits: Paranasal sinuses are aerated. Orbits are unremarkable.  Other: Sella is unremarkable.  Mastoid air cells are clear.  IMPRESSION: No evidence of recent infarction, hemorrhage, or mass. Moderate chronic microvascular ischemic changes.   Electronically Signed   By: Guadlupe Spanish M.D.   On: 05/17/2020 14:53  CT HEAD WITHOUT CONTRAST  CT CERVICAL SPINE WITHOUT CONTRAST  TECHNIQUE: Multidetector CT imaging of the head and cervical spine was performed following the standard protocol without intravenous contrast. Multiplanar CT image reconstructions of the cervical spine were also generated.  COMPARISON:  None.  FINDINGS: CT HEAD FINDINGS  Brain: Normal anatomic configuration. Mild to moderate  parenchymal volume loss is commensurate with the patient's age.  Mild periventricular white matter changes are present likely reflecting the sequela of small vessel ischemia. No abnormal intra or extra-axial mass lesion or fluid collection. No abnormal mass effect or midline shift. No evidence of acute intracranial hemorrhage or infarct. Ventricular size is normal. Cerebellum unremarkable.  Vascular: Moderate atherosclerotic calcification is seen within the carotid siphons bilaterally. No asymmetric hyperdense vasculature noted at the skull base.  Skull: Intact  Sinuses/Orbits: Paranasal sinuses are clear. Orbits are unremarkable.  Other: Mastoid air cells and middle ear cavities are clear. Small scalp hematoma seen within the right supraorbital region.  CT CERVICAL SPINE FINDINGS  Alignment: Normal cervical lordosis. No listhesis of the cervical spine.  Skull base and vertebrae: The craniocervical junction is unremarkable. The atlantodental interval is normal. Vertebral body height has been preserved. No acute fracture of the cervical spine. No lytic or blastic bone lesion.  Soft tissues and spinal canal: Moderate atherosclerotic calcifications seen within the carotid bifurcations bilaterally. No prevertebral soft tissue swelling or paraspinal fluid collections identified. No visible canal hematoma.  Disc levels: There is intervertebral disc space narrowing and endplate remodeling of C3-T2 in keeping with changes of diffuse moderate to severe degenerative disc disease. There is degenerative ankylosis of the vertebral bodies of C4 and C5. Review of the axial images demonstrates:  C1-2: Moderate atlantodental degenerative arthritis.  C2-3: Severe left facet arthrosis results in moderate left neural foraminal narrowing. No significant canal stenosis.  C3-4: Severe bilateral uncovertebral arthrosis, mild left facet arthrosis, and severe right facet arthrosis  results in severe right neural foraminal narrowing and moderate left neural foraminal narrowing. Mild effacement of the anterior canal space. No significant canal stenosis.  C4-5: Moderate bilateral uncovertebral arthrosis and mild right and moderate left facet arthrosis results in severe left and moderate to severe right neural foraminal narrowing. Effacement of the a anterior canal space without significant canal stenosis.  C5-6: Mild osteophytic ridging and mild bilateral facet arthrosis results in mild left and mild-to-moderate right neural foraminal narrowing. No significant canal stenosis.  C6-7: Moderate bilateral uncovertebral arthrosis and mild facet arthrosis results in moderate bilateral neural foraminal narrowing.  C7-T1: Moderate bilateral facet arthrosis. No significant neural foraminal narrowing. No canal stenosis.  Upper chest: The visualized lung apices are unremarkable.  Other: None significant  IMPRESSION: 1. Small right supraorbital scalp hematoma. 2. No acute intracranial abnormality. 3. No acute fracture or subluxation of the cervical spine. 4. Moderate to severe multilevel degenerative disc disease and facet arthrosis of the cervical spine, most prominent at C3-T2.   Electronically Signed   By: Helyn Numbers MD   On: 05/16/2020 21:19  Echo: Left ventricular ejection fraction, by estimation, is 60 to 65%. The left ventricle has normal function. The left ventricle has no regional wall motion abnormalities. Left ventricular diastolic parameters are consistent with Grade I diastolic dysfunction (impaired relaxation). 2. Right ventricular systolic function is normal. The right ventricular size is normal. Tricuspid regurgitation signal is inadequate for assessing PA pressure. 3. Challeng ing images.  Treatments: IV hydration, knee injection  Discharge Exam: Blood pressure (!) 152/82, pulse 64, temperature 97.6 F (36.4 C), temperature source  Oral, resp. rate 20, height 6' (1.829 m), weight 73.2 kg, SpO2 99 %. General appearance: alert and cooperative Resp: clear to auscultation bilaterally Cardio: regular rate and rhythm, S1, S2 normal, no murmur, click, rub or gallop GI: soft, non-tender; bowel sounds normal; no masses,  no organomegaly Extremities: extremities normal, atraumatic, no cyanosis or edema  Disposition: Discharge disposition: 03-Skilled Nursing  Facility       Discharge Instructions    Diet - low sodium heart healthy   Complete by: As directed    Discharge wound care:   Complete by: As directed    Follow with RN   Increase activity slowly   Complete by: As directed      Allergies as of 05/21/2020   No Known Allergies     Medication List    TAKE these medications   acetaminophen 500 MG tablet Commonly known as: TYLENOL Take 1,000 mg by mouth every 6 (six) hours as needed.   aspirin 81 MG chewable tablet Chew 81 mg by mouth daily.   nicotine 21 mg/24hr patch Commonly known as: NICODERM CQ - dosed in mg/24 hours Place 1 patch (21 mg total) onto the skin daily for 21 days. Start taking on: May 22, 2020   traMADol 50 MG tablet Commonly known as: ULTRAM Take 1 tablet (50 mg total) by mouth every 6 (six) hours as needed for moderate pain or severe pain.            Discharge Care Instructions  (From admission, onward)         Start     Ordered   05/21/20 0000  Discharge wound care:       Comments: Follow with RN   05/21/20 1225           Signed: Marrion Coy 05/21/2020, 12:25 PM

## 2020-05-21 NOTE — Care Management Important Message (Signed)
Important Message  Patient Details  Name: Keith Morris MRN: [id number] Date of Birth: 1931-10-26   Medicare Important Message Given:  Yes     Bernadette Hoit 05/21/2020, 11:35 AM

## 2020-05-21 NOTE — Progress Notes (Signed)
PT Cancellation Note  Patient Details Name: Keith Morris MRN: [id number] DOB: 19-Nov-1931   Cancelled Treatment:    Reason Eval/Treat Not Completed: Other (comment)   Discharge orders in.  Offered session.  Pt declined stating he is awaiting transport.     Danielle Dess 05/21/2020, 4:05 PM

## 2020-05-21 NOTE — TOC Progression Note (Signed)
Transition of Care [facility]) - Progression Note    Patient Details  Name: Keith Morris MRN: [id number] Date of Birth: 05-Sep-1932  [facility] CM/SW Contact  Eilleen Kempf, LCSW Phone Number: 05/21/2020, 8:23 AM  Clinical Narrative:    CSW submitted auth request in portal this morning for [facility].   Expected Discharge Plan: Skilled Nursing Facility Barriers to Discharge: No Barriers Identified  Expected Discharge Plan and Services Expected Discharge Plan: Skilled Nursing Facility In-house Referral: Clinical Social Work   Post Acute Care Choice: Skilled Nursing Facility Living arrangements for the past 2 months: Single Family Home                                       Social Determinants of Health (SDOH) Interventions    Readmission Risk Interventions No flowsheet data found.

## 2020-05-23 DIAGNOSIS — M25462 Effusion, left knee: Secondary | ICD-10-CM | POA: Diagnosis not present

## 2020-05-23 DIAGNOSIS — R55 Syncope and collapse: Secondary | ICD-10-CM | POA: Diagnosis not present

## 2020-05-23 DIAGNOSIS — R52 Pain, unspecified: Secondary | ICD-10-CM | POA: Diagnosis not present

## 2020-05-23 DIAGNOSIS — M179 Osteoarthritis of knee, unspecified: Secondary | ICD-10-CM | POA: Diagnosis not present

## 2020-05-24 DIAGNOSIS — D649 Anemia, unspecified: Secondary | ICD-10-CM | POA: Diagnosis not present

## 2020-05-24 DIAGNOSIS — D72829 Elevated white blood cell count, unspecified: Secondary | ICD-10-CM | POA: Diagnosis not present

## 2020-05-24 DIAGNOSIS — M25462 Effusion, left knee: Secondary | ICD-10-CM | POA: Diagnosis not present

## 2020-05-24 DIAGNOSIS — N179 Acute kidney failure, unspecified: Secondary | ICD-10-CM | POA: Diagnosis not present

## 2020-05-24 DIAGNOSIS — R55 Syncope and collapse: Secondary | ICD-10-CM | POA: Diagnosis not present

## 2020-05-29 DIAGNOSIS — S50311D Abrasion of right elbow, subsequent encounter: Secondary | ICD-10-CM | POA: Diagnosis not present

## 2020-05-29 DIAGNOSIS — S90415S Abrasion, left lesser toe(s), sequela: Secondary | ICD-10-CM | POA: Diagnosis not present

## 2020-05-29 DIAGNOSIS — R55 Syncope and collapse: Secondary | ICD-10-CM | POA: Diagnosis not present

## 2020-05-29 DIAGNOSIS — R531 Weakness: Secondary | ICD-10-CM | POA: Diagnosis not present

## 2020-06-22 DIAGNOSIS — L97512 Non-pressure chronic ulcer of other part of right foot with fat layer exposed: Secondary | ICD-10-CM | POA: Diagnosis not present

## 2020-06-22 DIAGNOSIS — L97521 Non-pressure chronic ulcer of other part of left foot limited to breakdown of skin: Secondary | ICD-10-CM | POA: Diagnosis not present

## 2020-09-14 DIAGNOSIS — M79674 Pain in right toe(s): Secondary | ICD-10-CM | POA: Diagnosis not present

## 2020-09-14 DIAGNOSIS — L97521 Non-pressure chronic ulcer of other part of left foot limited to breakdown of skin: Secondary | ICD-10-CM | POA: Diagnosis not present

## 2020-09-14 DIAGNOSIS — M79675 Pain in left toe(s): Secondary | ICD-10-CM | POA: Diagnosis not present

## 2020-09-14 DIAGNOSIS — B351 Tinea unguium: Secondary | ICD-10-CM | POA: Diagnosis not present

## 2020-09-14 DIAGNOSIS — L97512 Non-pressure chronic ulcer of other part of right foot with fat layer exposed: Secondary | ICD-10-CM | POA: Diagnosis not present

## 2020-12-07 DIAGNOSIS — L97511 Non-pressure chronic ulcer of other part of right foot limited to breakdown of skin: Secondary | ICD-10-CM | POA: Diagnosis not present

## 2020-12-07 DIAGNOSIS — L97512 Non-pressure chronic ulcer of other part of right foot with fat layer exposed: Secondary | ICD-10-CM | POA: Diagnosis not present

## 2020-12-07 DIAGNOSIS — L97522 Non-pressure chronic ulcer of other part of left foot with fat layer exposed: Secondary | ICD-10-CM | POA: Diagnosis not present

## 2020-12-07 DIAGNOSIS — L97521 Non-pressure chronic ulcer of other part of left foot limited to breakdown of skin: Secondary | ICD-10-CM | POA: Diagnosis not present

## 2021-02-01 DIAGNOSIS — L03031 Cellulitis of right toe: Secondary | ICD-10-CM | POA: Diagnosis not present

## 2021-02-01 DIAGNOSIS — M2031 Hallux varus (acquired), right foot: Secondary | ICD-10-CM | POA: Diagnosis not present

## 2021-02-01 DIAGNOSIS — L97521 Non-pressure chronic ulcer of other part of left foot limited to breakdown of skin: Secondary | ICD-10-CM | POA: Diagnosis not present

## 2021-02-01 DIAGNOSIS — L97512 Non-pressure chronic ulcer of other part of right foot with fat layer exposed: Secondary | ICD-10-CM | POA: Diagnosis not present

## 2021-02-01 DIAGNOSIS — L02611 Cutaneous abscess of right foot: Secondary | ICD-10-CM | POA: Diagnosis not present

## 2021-02-01 DIAGNOSIS — L97511 Non-pressure chronic ulcer of other part of right foot limited to breakdown of skin: Secondary | ICD-10-CM | POA: Diagnosis not present

## 2021-02-01 DIAGNOSIS — M2032 Hallux varus (acquired), left foot: Secondary | ICD-10-CM | POA: Diagnosis not present

## 2021-02-15 DIAGNOSIS — L97512 Non-pressure chronic ulcer of other part of right foot with fat layer exposed: Secondary | ICD-10-CM | POA: Diagnosis not present

## 2021-02-15 DIAGNOSIS — L97522 Non-pressure chronic ulcer of other part of left foot with fat layer exposed: Secondary | ICD-10-CM | POA: Diagnosis not present

## 2021-03-15 DIAGNOSIS — L97512 Non-pressure chronic ulcer of other part of right foot with fat layer exposed: Secondary | ICD-10-CM | POA: Diagnosis not present

## 2021-04-26 DIAGNOSIS — L97511 Non-pressure chronic ulcer of other part of right foot limited to breakdown of skin: Secondary | ICD-10-CM | POA: Diagnosis not present

## 2021-04-26 DIAGNOSIS — M2031 Hallux varus (acquired), right foot: Secondary | ICD-10-CM | POA: Diagnosis not present

## 2021-04-26 DIAGNOSIS — M2032 Hallux varus (acquired), left foot: Secondary | ICD-10-CM | POA: Diagnosis not present

## 2021-04-26 DIAGNOSIS — L97521 Non-pressure chronic ulcer of other part of left foot limited to breakdown of skin: Secondary | ICD-10-CM | POA: Diagnosis not present

## 2021-06-07 DIAGNOSIS — M79674 Pain in right toe(s): Secondary | ICD-10-CM | POA: Diagnosis not present

## 2021-06-07 DIAGNOSIS — M79675 Pain in left toe(s): Secondary | ICD-10-CM | POA: Diagnosis not present

## 2021-06-07 DIAGNOSIS — B351 Tinea unguium: Secondary | ICD-10-CM | POA: Diagnosis not present

## 2021-06-07 DIAGNOSIS — L97511 Non-pressure chronic ulcer of other part of right foot limited to breakdown of skin: Secondary | ICD-10-CM | POA: Diagnosis not present

## 2021-08-21 ENCOUNTER — Inpatient Hospital Stay
Admission: EM | Admit: 2021-08-21 | Discharge: 2021-08-27 | DRG: 956 | Disposition: A | Discharge status: Skilled Nursing Facility | Payer: Medicare Other | Attending: Internal Medicine | Admitting: Internal Medicine

## 2021-08-21 ENCOUNTER — Emergency Department: Payer: Medicare Other

## 2021-08-21 ENCOUNTER — Encounter: Payer: Self-pay | Admitting: Family Medicine

## 2021-08-21 DIAGNOSIS — Z7401 Bed confinement status: Secondary | ICD-10-CM | POA: Diagnosis not present

## 2021-08-21 DIAGNOSIS — I10 Essential (primary) hypertension: Secondary | ICD-10-CM | POA: Diagnosis present

## 2021-08-21 DIAGNOSIS — S50812A Abrasion of left forearm, initial encounter: Secondary | ICD-10-CM | POA: Diagnosis present

## 2021-08-21 DIAGNOSIS — Z6820 Body mass index (BMI) 20.0-20.9, adult: Secondary | ICD-10-CM | POA: Diagnosis not present

## 2021-08-21 DIAGNOSIS — S40021A Contusion of right upper arm, initial encounter: Secondary | ICD-10-CM | POA: Diagnosis present

## 2021-08-21 DIAGNOSIS — N179 Acute kidney failure, unspecified: Secondary | ICD-10-CM | POA: Diagnosis present

## 2021-08-21 DIAGNOSIS — S72001A Fracture of unspecified part of neck of right femur, initial encounter for closed fracture: Secondary | ICD-10-CM | POA: Diagnosis not present

## 2021-08-21 DIAGNOSIS — Y92481 Parking lot as the place of occurrence of the external cause: Secondary | ICD-10-CM

## 2021-08-21 DIAGNOSIS — M25551 Pain in right hip: Secondary | ICD-10-CM | POA: Diagnosis not present

## 2021-08-21 DIAGNOSIS — K5900 Constipation, unspecified: Secondary | ICD-10-CM | POA: Diagnosis not present

## 2021-08-21 DIAGNOSIS — F17210 Nicotine dependence, cigarettes, uncomplicated: Secondary | ICD-10-CM | POA: Diagnosis not present

## 2021-08-21 DIAGNOSIS — S7291XD Unspecified fracture of right femur, subsequent encounter for closed fracture with routine healing: Secondary | ICD-10-CM | POA: Diagnosis not present

## 2021-08-21 DIAGNOSIS — E440 Moderate protein-calorie malnutrition: Secondary | ICD-10-CM | POA: Diagnosis present

## 2021-08-21 DIAGNOSIS — Z743 Need for continuous supervision: Secondary | ICD-10-CM | POA: Diagnosis not present

## 2021-08-21 DIAGNOSIS — X58XXXA Exposure to other specified factors, initial encounter: Secondary | ICD-10-CM | POA: Diagnosis present

## 2021-08-21 DIAGNOSIS — R531 Weakness: Secondary | ICD-10-CM | POA: Diagnosis not present

## 2021-08-21 DIAGNOSIS — S72001D Fracture of unspecified part of neck of right femur, subsequent encounter for closed fracture with routine healing: Secondary | ICD-10-CM | POA: Diagnosis not present

## 2021-08-21 DIAGNOSIS — R55 Syncope and collapse: Secondary | ICD-10-CM | POA: Diagnosis not present

## 2021-08-21 DIAGNOSIS — S72009A Fracture of unspecified part of neck of unspecified femur, initial encounter for closed fracture: Secondary | ICD-10-CM

## 2021-08-21 DIAGNOSIS — D72829 Elevated white blood cell count, unspecified: Secondary | ICD-10-CM | POA: Diagnosis not present

## 2021-08-21 DIAGNOSIS — S51811A Laceration without foreign body of right forearm, initial encounter: Secondary | ICD-10-CM | POA: Diagnosis not present

## 2021-08-21 DIAGNOSIS — R6889 Other general symptoms and signs: Secondary | ICD-10-CM | POA: Diagnosis not present

## 2021-08-21 DIAGNOSIS — D62 Acute posthemorrhagic anemia: Secondary | ICD-10-CM | POA: Diagnosis not present

## 2021-08-21 DIAGNOSIS — R262 Difficulty in walking, not elsewhere classified: Secondary | ICD-10-CM | POA: Diagnosis not present

## 2021-08-21 DIAGNOSIS — R5381 Other malaise: Secondary | ICD-10-CM | POA: Diagnosis not present

## 2021-08-21 DIAGNOSIS — L89153 Pressure ulcer of sacral region, stage 3: Secondary | ICD-10-CM | POA: Diagnosis not present

## 2021-08-21 DIAGNOSIS — L89152 Pressure ulcer of sacral region, stage 2: Secondary | ICD-10-CM | POA: Diagnosis present

## 2021-08-21 DIAGNOSIS — Z7982 Long term (current) use of aspirin: Secondary | ICD-10-CM

## 2021-08-21 DIAGNOSIS — T796XXA Traumatic ischemia of muscle, initial encounter: Secondary | ICD-10-CM | POA: Diagnosis present

## 2021-08-21 DIAGNOSIS — Z419 Encounter for procedure for purposes other than remedying health state, unspecified: Secondary | ICD-10-CM

## 2021-08-21 DIAGNOSIS — S72141A Displaced intertrochanteric fracture of right femur, initial encounter for closed fracture: Principal | ICD-10-CM | POA: Diagnosis present

## 2021-08-21 DIAGNOSIS — S40022A Contusion of left upper arm, initial encounter: Secondary | ICD-10-CM | POA: Diagnosis not present

## 2021-08-21 DIAGNOSIS — W1830XA Fall on same level, unspecified, initial encounter: Secondary | ICD-10-CM | POA: Diagnosis present

## 2021-08-21 DIAGNOSIS — R1312 Dysphagia, oropharyngeal phase: Secondary | ICD-10-CM | POA: Diagnosis not present

## 2021-08-21 DIAGNOSIS — L8990 Pressure ulcer of unspecified site, unspecified stage: Secondary | ICD-10-CM | POA: Insufficient documentation

## 2021-08-21 DIAGNOSIS — Z20822 Contact with and (suspected) exposure to covid-19: Secondary | ICD-10-CM | POA: Diagnosis present

## 2021-08-21 DIAGNOSIS — S7291XA Unspecified fracture of right femur, initial encounter for closed fracture: Secondary | ICD-10-CM

## 2021-08-21 DIAGNOSIS — F32A Depression, unspecified: Secondary | ICD-10-CM | POA: Diagnosis not present

## 2021-08-21 DIAGNOSIS — W19XXXA Unspecified fall, initial encounter: Secondary | ICD-10-CM | POA: Diagnosis present

## 2021-08-21 DIAGNOSIS — Z9889 Other specified postprocedural states: Secondary | ICD-10-CM | POA: Diagnosis not present

## 2021-08-21 DIAGNOSIS — W19XXXD Unspecified fall, subsequent encounter: Secondary | ICD-10-CM | POA: Diagnosis not present

## 2021-08-21 DIAGNOSIS — I70233 Atherosclerosis of native arteries of right leg with ulceration of ankle: Secondary | ICD-10-CM | POA: Diagnosis not present

## 2021-08-21 DIAGNOSIS — R52 Pain, unspecified: Secondary | ICD-10-CM | POA: Diagnosis not present

## 2021-08-21 DIAGNOSIS — S7290XA Unspecified fracture of unspecified femur, initial encounter for closed fracture: Secondary | ICD-10-CM | POA: Diagnosis present

## 2021-08-21 DIAGNOSIS — Z720 Tobacco use: Secondary | ICD-10-CM | POA: Diagnosis present

## 2021-08-21 HISTORY — DX: Other specified health status: Z78.9

## 2021-08-21 HISTORY — DX: Essential (primary) hypertension: I10

## 2021-08-21 LAB — COMPREHENSIVE METABOLIC PANEL
ALT: 10 U/L (ref 0–44)
AST: 25 U/L (ref 15–41)
Albumin: 3.9 g/dL (ref 3.5–5.0)
Alkaline Phosphatase: 84 U/L (ref 38–126)
Anion gap: 8 (ref 5–15)
BUN: 34 mg/dL — ABNORMAL HIGH (ref 8–23)
CO2: 22 mmol/L (ref 22–32)
Calcium: 8.6 mg/dL — ABNORMAL LOW (ref 8.9–10.3)
Chloride: 108 mmol/L (ref 98–111)
Creatinine, Ser: 1.6 mg/dL — ABNORMAL HIGH (ref 0.61–1.24)
GFR, Estimated: 41 mL/min — ABNORMAL LOW (ref >60)
Glucose, Bld: 118 mg/dL — ABNORMAL HIGH (ref 70–99)
Potassium: 3.8 mmol/L (ref 3.5–5.1)
Sodium: 138 mmol/L (ref 135–145)
Total Bilirubin: 1.3 mg/dL — ABNORMAL HIGH (ref 0.3–1.2)
Total Protein: 7.6 g/dL (ref 6.5–8.1)

## 2021-08-21 LAB — CBC WITH DIFFERENTIAL/PLATELET
Abs Immature Granulocytes: 0.11 10*3/uL — ABNORMAL HIGH (ref 0.00–0.07)
Basophils Absolute: 0.1 10*3/uL (ref 0.0–0.1)
Basophils Relative: 0 %
Eosinophils Absolute: 0 10*3/uL (ref 0.0–0.5)
Eosinophils Relative: 0 %
HCT: 31.7 % — ABNORMAL LOW (ref 39.0–52.0)
Hemoglobin: 10.6 g/dL — ABNORMAL LOW (ref 13.0–17.0)
Immature Granulocytes: 1 %
Lymphocytes Relative: 9 %
Lymphs Abs: 1.3 10*3/uL (ref 0.7–4.0)
MCH: 32.7 pg (ref 26.0–34.0)
MCHC: 33.4 g/dL (ref 30.0–36.0)
MCV: 97.8 fL (ref 80.0–100.0)
Monocytes Absolute: 1 10*3/uL (ref 0.1–1.0)
Monocytes Relative: 7 %
Neutro Abs: 11.8 10*3/uL — ABNORMAL HIGH (ref 1.7–7.7)
Neutrophils Relative %: 83 %
Platelets: 206 10*3/uL (ref 150–400)
RBC: 3.24 MIL/uL — ABNORMAL LOW (ref 4.22–5.81)
RDW: 13.8 % (ref 11.5–15.5)
WBC: 14.3 10*3/uL — ABNORMAL HIGH (ref 4.0–10.5)
nRBC: 0 % (ref 0.0–0.2)

## 2021-08-21 LAB — RESP PANEL BY RT-PCR (FLU A&B, COVID) ARPGX2
Influenza A by PCR: NEGATIVE
Influenza B by PCR: NEGATIVE
SARS Coronavirus 2 by RT PCR: NEGATIVE

## 2021-08-21 LAB — CK: Total CK: 493 U/L — ABNORMAL HIGH (ref 49–397)

## 2021-08-21 MED ORDER — ENOXAPARIN SODIUM 40 MG/0.4ML IJ SOSY
40.0000 mg | PREFILLED_SYRINGE | INTRAMUSCULAR | Status: DC
  Administered 2021-08-21: 40 mg via SUBCUTANEOUS
  Filled 2021-08-21: qty 0.4

## 2021-08-21 MED ORDER — SODIUM CHLORIDE 0.9 % IV SOLN
INTRAVENOUS | Status: AC
  Administered 2021-08-22: 11:00:00 75 mL/h via INTRAVENOUS

## 2021-08-21 MED ORDER — FENTANYL CITRATE PF 50 MCG/ML IJ SOSY
50.0000 ug | PREFILLED_SYRINGE | Freq: Once | INTRAMUSCULAR | Status: AC
  Administered 2021-08-21: 50 ug via INTRAVENOUS
  Filled 2021-08-21: qty 1

## 2021-08-21 MED ORDER — HYDROCODONE-ACETAMINOPHEN 5-325 MG PO TABS
1.0000 | ORAL_TABLET | Freq: Four times a day (QID) | ORAL | Status: DC | PRN
  Administered 2021-08-21: 19:00:00 2 via ORAL
  Administered 2021-08-22 (×2): 1 via ORAL
  Filled 2021-08-21: qty 1
  Filled 2021-08-21: qty 2
  Filled 2021-08-21: qty 1

## 2021-08-21 MED ORDER — SODIUM CHLORIDE 0.9 % IV SOLN: INTRAVENOUS | Status: DC

## 2021-08-21 MED ORDER — DOCUSATE SODIUM 100 MG PO CAPS
100.0000 mg | ORAL_CAPSULE | Freq: Two times a day (BID) | ORAL | Status: DC
  Administered 2021-08-21 – 2021-08-27 (×11): 100 mg via ORAL
  Filled 2021-08-21 (×11): qty 1

## 2021-08-21 MED ORDER — TRAMADOL HCL 50 MG PO TABS: 50.0000 mg | ORAL_TABLET | Freq: Four times a day (QID) | ORAL | Status: DC | PRN

## 2021-08-21 MED ORDER — CEFAZOLIN SODIUM-DEXTROSE 2-4 GM/100ML-% IV SOLN
2.0000 g | INTRAVENOUS | Status: AC
  Administered 2021-08-22: 13:00:00 2 g via INTRAVENOUS
  Filled 2021-08-21: qty 100

## 2021-08-21 MED ORDER — SODIUM CHLORIDE 0.9 % IV BOLUS
500.0000 mL | Freq: Once | INTRAVENOUS | Status: AC
  Administered 2021-08-21: 500 mL via INTRAVENOUS

## 2021-08-21 MED ORDER — BISACODYL 5 MG PO TBEC
5.0000 mg | DELAYED_RELEASE_TABLET | Freq: Every day | ORAL | Status: DC | PRN
  Administered 2021-08-23: 5 mg via ORAL
  Filled 2021-08-21: qty 1

## 2021-08-21 MED ORDER — ACETAMINOPHEN 500 MG PO TABS
1000.0000 mg | ORAL_TABLET | Freq: Four times a day (QID) | ORAL | Status: DC | PRN
  Administered 2021-08-26: 1000 mg via ORAL
  Filled 2021-08-21: qty 2

## 2021-08-21 NOTE — Consult Note (Addendum)
ORTHOPAEDIC CONSULTATION  REQUESTING PHYSICIAN: Cipriano Bunker, MD  Chief Complaint: Right hip pain status post fall  HPI: Keith Morris is a 87 y.o. male who presents to the ER today with pain in the right hip.  Patient sustained a fall on Monday coming out of the barbershop.  He was initially able to get home with the help of his grandson but has been unable to ambulate or weight-bear on the right lower extremity since the injury.  Patient uses a walker at baseline.  Patient is a smoker.  He is not on anticoagulation except for aspirin.  Patient has a history of Charcot-Marie-Tooth disease requiring lower extremity surgery when he was a teenager.  As a result the patient has diminished movement at baseline of his right ankle and toes.  History reviewed. No pertinent past medical history. Past Surgical History:  Procedure Laterality Date   foot surgery bilaterally     Social History   Socioeconomic History   Marital status: Married    Spouse name: Not on file   Number of children: Not on file   Years of education: Not on file   Highest education level: Not on file  Occupational History   Not on file  Tobacco Use   Smoking status: Every Day   Smokeless tobacco: Never  Substance and Sexual Activity   Alcohol use: Not Currently   Drug use: Never   Sexual activity: Not on file  Other Topics Concern   Not on file  Social History Narrative   Not on file   Social Determinants of Health   Financial Resource Strain: Not on file  Food Insecurity: Not on file  Transportation Needs: Not on file  Physical Activity: Not on file  Stress: Not on file  Social Connections: Not on file   History reviewed. No pertinent family history. No Known Allergies Prior to Admission medications   Medication Sig Start Date End Date Taking? Authorizing Provider  acetaminophen (TYLENOL) 500 MG tablet Take 1,000 mg by mouth every 6 (six) hours as needed.   Yes [provider]  aspirin 81  MG chewable tablet Chew 81 mg by mouth daily.   Yes [provider]  traMADol (ULTRAM) 50 MG tablet Take 1 tablet (50 mg total) by mouth every 6 (six) hours as needed for moderate pain or severe pain. Patient not taking: Reported on 08/21/2021 05/21/20   Marrion Coy, MD   DG Chest 1 View  Result Date: 08/21/2021 CLINICAL DATA:  Status post fall with subsequent right hip pain. EXAM: CHEST  1 VIEW COMPARISON:  None. FINDINGS: The heart size and mediastinal contours are within normal limits. Both lungs are clear. Degenerative changes seen involving the bilateral shoulders and thoracic spine. IMPRESSION: No active disease. Electronically Signed   By: Aram Candela M.D.   On: 08/21/2021 17:54   DG Pelvis 1-2 Views  Result Date: 08/21/2021 CLINICAL DATA:  Right-sided hip and thigh pain following fall EXAM: PELVIS - 1 VIEW; RIGHT FEMUR 2 VIEWS COMPARISON:  None. FINDINGS: Intertrochanteric fracture of the right femur, with rotation and impaction. No other fracture is seen in the pelvis or femur. Severe degenerative changes at the knee. Degenerative changes are also noted in the bilateral acetabula, sacroiliac joints, and lower lumbar spine. IMPRESSION: Intertrochanteric fracture of the right femur. Electronically Signed   By: Wiliam Ke M.D.   On: 08/21/2021 17:55   DG Femur Min 2 Views Right  Result Date: 08/21/2021 CLINICAL DATA:  Right-sided hip and  thigh pain following fall EXAM: PELVIS - 1 VIEW; RIGHT FEMUR 2 VIEWS COMPARISON:  None. FINDINGS: Intertrochanteric fracture of the right femur, with rotation and impaction. No other fracture is seen in the pelvis or femur. Severe degenerative changes at the knee. Degenerative changes are also noted in the bilateral acetabula, sacroiliac joints, and lower lumbar spine. IMPRESSION: Intertrochanteric fracture of the right femur. Electronically Signed   By: Wiliam Ke M.D.   On: 08/21/2021 17:55    Positive ROS: All other systems have been  reviewed and were otherwise negative with the exception of those mentioned in the HPI and as above.  Physical Exam: General: Alert, no acute distress  MUSCULOSKELETAL: Right lower extremity: Patient has shortening and external rotation of the right lower extremity.  Patient skin is intact overlying the right hip.  He has mild swelling but no erythema.  There is no obvious ecchymosis.  Patient has diminished dorsiflexion plantarflexion of the right ankle as well as flexion extension of the toes.  Patient has intact sensation light touch.  His leg and thigh compartments are soft and compressible.  His foot is well-perfused and warm to touch.  Assessment: Right closed, displaced intertrochanteric hip fracture  Plan: Patient is seen in the ER with his grandson and daughter at the bedside.  I explained the nature of the patient's fracture to them.  I have recommended intramedullary fixation for his fracture.  I discussed the details of the operation as well as the postoperative course.  I also discussed the risks and benefits of surgery. They understand risks include but are not limited to infection, bleeding requiring blood transfusion, nerve or blood vessel injury, joint stiffness or loss of motion, persistent pain, weakness or instability, malunion, nonunion and hardware failure and the need for further surgery. Medical risks include but are not limited to DVT and pulmonary embolism, myocardial infarction, stroke, pneumonia, respiratory failure and death. Patient and his family understood these risks and wished to proceed.  I answered all their questions.  Patient is being admitted to the hospitalist service.  He has been seen by Dr. Lucianne Muss.  Patient is cleared medically for surgery.  Surgery is scheduled for tomorrow morning.  Patient will be n.p.o. after midnight.     Juanell Fairly, MD    08/21/2021 7:59 PM

## 2021-08-21 NOTE — H&P (Addendum)
History and Physical    Keith Morris ZDG:[id number] DOB: 1932-01-06 DOA: 08/21/2021  PCP: Pcp, No  Patient coming from: Home  I have personally briefly reviewed patient's old medical records in [facility]  Chief Complaint: Right hip hip pain s/p mechanical fall.  HPI: Keith Morris is a 87 y.o. male with PMH significant of tobacco abuse presented to the ED s/p mechanical fall in the parking lot outside the barbershop 2 days ago.  He denies any head trauma or head injury.  He denies any loss of consciousness, nausea or vomiting.  Patient has developed right thigh pain which was manageable initially at assisted home. Patient has been remained in bed for last 2 days,  unable to walk due to the pain in the right hip.  He has not been able to walk to eat or drink.  Patient was brought in the ED by his grandson. Patient has multiple bruises noted on his arms, grandson reported he has tendency to hit his hands on the [place].  ED Course: He was hemodynamically stable except hypertension. HR 90, RR 16, BP 177/80, SPO2 100% on room air, temp 97.7 Labs include sodium 128, potassium 3.8, chloride 108, bicarb 22, glucose 118, BUN 34, creatinine 1.60, calcium 8.6, anion gap 8, alkaline phosphatase 84, AST 25, ALT 10, total protein 7.6, total bilirubin 1.3, CK4 93, WBC 14.3, hemoglobin 10.6, hematocrit 31.7, platelet 206, influenza negative, COVID-negative. Chest x-ray shows no acute infiltrate. X-ray right femur right pelvis: Intertrochanteric fracture of right femur.  Review of Systems:  Review of Systems  Constitutional: Negative.   HENT: Negative.    Eyes: Negative.   Respiratory: Negative.    Cardiovascular: Negative.   Gastrointestinal: Negative.   Genitourinary: Negative.   Musculoskeletal:  Positive for falls.       Right thigh and hip pain.  Skin:        Multiple ecchymosis, skin tearing on left arm  Neurological: Negative.   Endo/Heme/Allergies:  Bruises/bleeds easily.   Psychiatric/Behavioral: Negative.      History reviewed. No pertinent past medical history.  Past Surgical History:  Procedure Laterality Date   foot surgery bilaterally       reports that he has been smoking. He has never used smokeless tobacco. He reports that he does not currently use alcohol. He reports that he does not use drugs.  No Known Allergies  History reviewed. No pertinent family history.  Family history reviewed and not pertinent.  Prior to Admission medications   Medication Sig Start Date End Date Taking? Authorizing Provider  acetaminophen (TYLENOL) 500 MG tablet Take 1,000 mg by mouth every 6 (six) hours as needed.    [provider]  aspirin 81 MG chewable tablet Chew 81 mg by mouth daily.    [provider]  traMADol (ULTRAM) 50 MG tablet Take 1 tablet (50 mg total) by mouth every 6 (six) hours as needed for moderate pain or severe pain. 05/21/20   Marrion Coy, MD    Physical Exam: Vitals:   08/21/21 1627 08/21/21 1632 08/21/21 1700  BP:  (!) 172/78 (!) 177/80  Pulse:  89 90  Resp:  16 16  Temp:  97.9 F (36.6 C)   TempSrc:  Oral   SpO2:  100% 100%  Weight: 50 kg      Constitutional: Appears comfortable, chronically ill looking, deconditioned.  Not in any distress Vitals:   08/21/21 1627 08/21/21 1632 08/21/21 1700  BP:  (!) 172/78 (!) 177/80  Pulse:  89 90  Resp:  16 16  Temp:  97.9 F (36.6 C)   TempSrc:  Oral   SpO2:  100% 100%  Weight: 50 kg     Eyes: PERRL, lids and conjunctivae normal ENMT: Mucous membranes are moist.  No exudate, no erythema..Normal dentition.  Neck: normal, supple, no masses, no thyromegaly Respiratory: Clear to auscultation bilaterally, no wheezing, no crackles, Cardiovascular: S1-S2 heard, regular rate and rhythm, no murmur. Abdomen: No tenderness, abdomen soft, nondistended, BS +. musculoskeletal: Right lower extremity externally rotated, significant tenderness noted in the right thigh area.   Restricted movements Skin: Significant bruising noted on the both arms, skin tearing. Neurologic: CN 2-12 grossly intact. Sensation intact, DTR normal. Strength 5/5 in all 4.  Psychiatric: Normal judgment and insight. Alert and oriented x 3. Normal mood.     Labs on Admission: I have personally reviewed following labs and imaging studies  CBC: Recent Labs  Lab 08/21/21 1716  WBC 14.3*  NEUTROABS 11.8*  HGB 10.6*  HCT 31.7*  MCV 97.8  PLT 99991111   Basic Metabolic Panel: Recent Labs  Lab 08/21/21 1716  NA 138  K 3.8  CL 108  CO2 22  GLUCOSE 118*  BUN 34*  CREATININE 1.60*  CALCIUM 8.6*   GFR: CrCl cannot be calculated (Unknown ideal weight.). Liver Function Tests: Recent Labs  Lab 08/21/21 1716  AST 25  ALT 10  ALKPHOS 84  BILITOT 1.3*  PROT 7.6  ALBUMIN 3.9   No results for input(s): LIPASE, AMYLASE in the last 168 hours. No results for input(s): AMMONIA in the last 168 hours. Coagulation Profile: No results for input(s): INR, PROTIME in the last 168 hours. Cardiac Enzymes: Recent Labs  Lab 08/21/21 1716  CKTOTAL 493*   BNP (last 3 results) No results for input(s): PROBNP in the last 8760 hours. HbA1C: No results for input(s): HGBA1C in the last 72 hours. CBG: No results for input(s): GLUCAP in the last 168 hours. Lipid Profile: No results for input(s): CHOL, HDL, LDLCALC, TRIG, CHOLHDL, LDLDIRECT in the last 72 hours. Thyroid Function Tests: No results for input(s): TSH, T4TOTAL, FREET4, T3FREE, THYROIDAB in the last 72 hours. Anemia Panel: No results for input(s): VITAMINB12, FOLATE, FERRITIN, TIBC, IRON, RETICCTPCT in the last 72 hours. Urine analysis:    Component Value Date/Time   COLORURINE YELLOW (A) 05/16/2020 2015   APPEARANCEUR CLEAR (A) 05/16/2020 2015   LABSPEC 1.015 05/16/2020 2015   PHURINE 5.0 05/16/2020 2015   GLUCOSEU NEGATIVE 05/16/2020 2015   HGBUR SMALL (A) 05/16/2020 2015   BILIRUBINUR NEGATIVE 05/16/2020 2015   KETONESUR  NEGATIVE 05/16/2020 2015   PROTEINUR NEGATIVE 05/16/2020 2015   NITRITE NEGATIVE 05/16/2020 2015   LEUKOCYTESUR NEGATIVE 05/16/2020 2015    Radiological Exams on Admission: DG Chest 1 View  Result Date: 08/21/2021 CLINICAL DATA:  Status post fall with subsequent right hip pain. EXAM: CHEST  1 VIEW COMPARISON:  None. FINDINGS: The heart size and mediastinal contours are within normal limits. Both lungs are clear. Degenerative changes seen involving the bilateral shoulders and thoracic spine. IMPRESSION: No active disease. Electronically Signed   By: Virgina Norfolk M.D.   On: 08/21/2021 17:54   DG Pelvis 1-2 Views  Result Date: 08/21/2021 CLINICAL DATA:  Right-sided hip and thigh pain following fall EXAM: PELVIS - 1 VIEW; RIGHT FEMUR 2 VIEWS COMPARISON:  None. FINDINGS: Intertrochanteric fracture of the right femur, with rotation and impaction. No other fracture is seen in the pelvis or femur. Severe  degenerative changes at the knee. Degenerative changes are also noted in the bilateral acetabula, sacroiliac joints, and lower lumbar spine. IMPRESSION: Intertrochanteric fracture of the right femur. Electronically Signed   By: Merilyn Baba M.D.   On: 08/21/2021 17:55   DG Femur Min 2 Views Right  Result Date: 08/21/2021 CLINICAL DATA:  Right-sided hip and thigh pain following fall EXAM: PELVIS - 1 VIEW; RIGHT FEMUR 2 VIEWS COMPARISON:  None. FINDINGS: Intertrochanteric fracture of the right femur, with rotation and impaction. No other fracture is seen in the pelvis or femur. Severe degenerative changes at the knee. Degenerative changes are also noted in the bilateral acetabula, sacroiliac joints, and lower lumbar spine. IMPRESSION: Intertrochanteric fracture of the right femur. Electronically Signed   By: Merilyn Baba M.D.   On: 08/21/2021 17:55    EKG: Not completed.  EKG ordered please review  Assessment/Plan Principal Problem:   Closed femur fracture [facility]) Active Problems:   Fall    AKI (acute kidney injury) ([place])   Tobacco abuse  Right intertrochanteric femur fracture s/p mechanical fall: Patient reported fall 2 days ago in the parking lot. Pain was initially manageable but has gotten worse. X-ray shows intertrochanteric fracture of right femur. Continue adequate pain control with hydrocodone as needed Ortho consulted, planning for right hip hemiarthroplasty in the a.m.   Mild rhabdomyolysis: CK4 93, consistent with right thigh pain Continue IV gentle hydration, monitor CK level.  AKI: Could be prerenal,  s/p fall with decreased p.o. intake Avoid nephrotoxic medication, continue IV gentle hydration. Recheck a.m. labs  Leukocytosis: It could be reactive, recheck a.m. labs. No signs of any infection.  Tobacco abuse: Counseling completed.   DVT prophylaxis: Lovenox Code Status: Full code. Family Communication: Family at bed side. Disposition Plan:   Status is: Inpatient  Remains inpatient appropriate because: Right femur fracture, s/p Fall.  Consults called: [facility] Admission status: Inpatient   Shawna Clamp MD [facility]   If 7PM-7AM, please contact night-coverage   08/21/2021, 7:25 PM

## 2021-08-21 NOTE — ED Provider Notes (Signed)
[facility] Emergency Department Provider Note    ____________________________________________   Event Date/Time   First MD Initiated Contact with Patient 08/21/21 1627     (approximate)  I have reviewed the triage vital signs and the nursing notes.   HISTORY  Chief Complaint Fall   HPI Keith Morris is a 87 y.o. male presenting to the emergency department for evaluation of hip and right thigh pain following fall.  He states that he was walking out of a barbershop down a slope in a parking lot when he experienced a mechanical fall onto his right side.  Denies hitting his head or experiencing loss of consciousness. Denies any preceding chest pain, lightheadedness, or dizziness.  After being assisted home, he has been laying in bed for the past 2 days, unable to walk due to pain in his right hip.  He states that he has not been able to walk to eat or drink.  Per EMS, family has been reportedly giving patient meals, but he was found soaked in his own urine and admitted to having bowel movements in his bed.  RN has notified APS.   History limited by: Patient struggles to articulate words.  History reviewed. No pertinent past medical history.  Patient Active Problem List   Diagnosis Date Noted   Closed femur fracture (HCC) 08/21/2021   Rhabdomyolysis 05/18/2020   Dehydration    Skin tear of right elbow without complication    Knee effusion, left    Fall 05/17/2020   AKI (acute kidney injury) (HCC) 05/17/2020   Elevated troponin 05/17/2020   Elevated CK 05/17/2020   Leukocytosis 05/17/2020   Tobacco abuse 05/17/2020   Syncope 05/17/2020   Abnormal EKG     Past Surgical History:  Procedure Laterality Date   foot surgery bilaterally      Prior to Admission medications   Medication Sig Start Date End Date Taking? Authorizing Provider  acetaminophen (TYLENOL) 500 MG tablet Take 1,000 mg by mouth every 6 (six) hours as needed.   Yes [provider]  aspirin 81 MG chewable tablet Chew 81 mg by mouth daily.   Yes [provider]  traMADol (ULTRAM) 50 MG tablet Take 1 tablet (50 mg total) by mouth every 6 (six) hours as needed for moderate pain or severe pain. Patient not taking: Reported on 08/21/2021 05/21/20   Marrion Coy, MD    Allergies Patient has no known allergies.  History reviewed. No pertinent family history.  Social History Social History   Tobacco Use   Smoking status: Every Day   Smokeless tobacco: Never  Substance Use Topics   Alcohol use: Not Currently   Drug use: Never    Review of Systems  Constitutional: Negative for fever/chills, weight loss, or fatigue.  Eyes: Negative for visual changes or discharge.  ENT: Negative for congestion, hearing changes, or sore throat.  Gastrointestinal: Negative for abdominal pain, nausea/vomiting, or diarrhea.  Genitourinary: Negative for dysuria or hematuria.  Musculoskeletal: Positive for right hip, thigh, and knee pain. Negative for back pain or joint pain.  Skin: Positive abrasions on the right and left forearm negative for rashes. Neurological: Negative for headache, syncope, dizziness, tremors, or numbness/tingling.   10-point ROS otherwise negative. ____________________________________________   PHYSICAL EXAM:  VITAL SIGNS: ED Triage Vitals  Enc Vitals Group     BP 08/21/21 1632 (!) 172/78     Pulse Rate 08/21/21 1632 89     Resp 08/21/21 1632 16  Temp 08/21/21 1632 97.9 F (36.6 C)     Temp Source 08/21/21 1632 Oral     SpO2 08/21/21 1632 100 %     Weight 08/21/21 1627 110 lb 3.7 oz (50 kg)     Height --      Head Circumference --      Peak Flow --      Pain Score 08/21/21 1627 7     Pain Loc --      Pain Edu? --      Excl. in [place]? --     Physical Exam Constitutional:      Appearance: He is not ill-appearing.  HENT:     Head: Normocephalic and atraumatic.     Right Ear: External ear normal.     Left Ear: External ear normal.      Nose: Nose normal.     Mouth/Throat:     Mouth: Mucous membranes are moist.     Pharynx: Oropharynx is clear. No oropharyngeal exudate or posterior oropharyngeal erythema.  Eyes:     Extraocular Movements: Extraocular movements intact.     Conjunctiva/sclera: Conjunctivae normal.     Pupils: Pupils are equal, round, and reactive to light.  Cardiovascular:     Rate and Rhythm: Normal rate.     Pulses: Normal pulses.     Heart sounds: Normal heart sounds. No murmur heard.   No friction rub. No gallop.  Pulmonary:     Effort: Pulmonary effort is normal. No respiratory distress.     Breath sounds: Normal breath sounds. No wheezing or rhonchi.  Abdominal:     General: Abdomen is flat. Bowel sounds are normal. There is no distension.     Palpations: Abdomen is soft.     Tenderness: There is no abdominal tenderness.  Genitourinary:    Penis: Normal.      Testes: Normal.  Musculoskeletal:     Cervical back: Normal range of motion and neck supple. No rigidity or tenderness.     Comments: Significant tenderness noted on the right hip and proximal/midshaft femur.  Leg is shortened and internally rotated.  Patient is unable to lift the lower extremity.  Pulse is intact.  Sensation intact.  Left lower extremity is normal.   Skin:    General: Skin is warm and dry.     Comments: 4 cm abrasion noted on the left forearm.  5 cm skin tear on the back of the right forearm, with old gauze dressing adhered tightly to the wound.  Bruising scattered across upper extremities, laterally.  Neurological:     General: No focal deficit present.     Mental Status: He is alert and oriented to person, place, and time.     Cranial Nerves: No cranial nerve deficit.     Sensory: No sensory deficit.  Psychiatric:        Mood and Affect: Mood normal.        Behavior: Behavior normal.        Thought Content: Thought content normal.        Judgment: Judgment normal.      ____________________________________________    LABS  (all labs ordered are listed, but only abnormal results are displayed)  Labs Reviewed  COMPREHENSIVE METABOLIC PANEL - Abnormal; Notable for the following components:      Result Value   Glucose, Bld 118 (*)    BUN 34 (*)    Creatinine, Ser 1.60 (*)    Calcium 8.6 (*)    Total  Bilirubin 1.3 (*)    GFR, Estimated 41 (*)    All other components within normal limits  CBC WITH DIFFERENTIAL/PLATELET - Abnormal; Notable for the following components:   WBC 14.3 (*)    RBC 3.24 (*)    Hemoglobin 10.6 (*)    HCT 31.7 (*)    Neutro Abs 11.8 (*)    Abs Immature Granulocytes 0.11 (*)    All other components within normal limits  CK - Abnormal; Notable for the following components:   Total CK 493 (*)    All other components within normal limits  RESP PANEL BY RT-PCR (FLU A&B, COVID) ARPGX2  CBC  VITAMIN D 25 HYDROXY (VIT D DEFICIENCY, FRACTURES)  CK  BASIC METABOLIC PANEL  MAGNESIUM  PHOSPHORUS     ____________________________________________   EKG None.   ____________________________________________    RADIOLOGY I personally viewed and evaluated these images as part of my medical decision making, as well as reviewing the written report by the radiologist.  ED Provider Interpretation: I agree with the radiologist interpretation.  Intertrochanteric fracture of the right femur..  DG Chest 1 View  Result Date: 08/21/2021 CLINICAL DATA:  Status post fall with subsequent right hip pain. EXAM: CHEST  1 VIEW COMPARISON:  None. FINDINGS: The heart size and mediastinal contours are within normal limits. Both lungs are clear. Degenerative changes seen involving the bilateral shoulders and thoracic spine. IMPRESSION: No active disease. Electronically Signed   By: Virgina Norfolk M.D.   On: 08/21/2021 17:54   DG Pelvis 1-2 Views  Result Date: 08/21/2021 CLINICAL DATA:  Right-sided hip and thigh pain following fall  EXAM: PELVIS - 1 VIEW; RIGHT FEMUR 2 VIEWS COMPARISON:  None. FINDINGS: Intertrochanteric fracture of the right femur, with rotation and impaction. No other fracture is seen in the pelvis or femur. Severe degenerative changes at the knee. Degenerative changes are also noted in the bilateral acetabula, sacroiliac joints, and lower lumbar spine. IMPRESSION: Intertrochanteric fracture of the right femur. Electronically Signed   By: Merilyn Baba M.D.   On: 08/21/2021 17:55   DG Femur Min 2 Views Right  Result Date: 08/21/2021 CLINICAL DATA:  Right-sided hip and thigh pain following fall EXAM: PELVIS - 1 VIEW; RIGHT FEMUR 2 VIEWS COMPARISON:  None. FINDINGS: Intertrochanteric fracture of the right femur, with rotation and impaction. No other fracture is seen in the pelvis or femur. Severe degenerative changes at the knee. Degenerative changes are also noted in the bilateral acetabula, sacroiliac joints, and lower lumbar spine. IMPRESSION: Intertrochanteric fracture of the right femur. Electronically Signed   By: Merilyn Baba M.D.   On: 08/21/2021 17:55    ____________________________________________   PROCEDURES  Procedures   Medications  acetaminophen (TYLENOL) tablet 1,000 mg (has no administration in time range)  HYDROcodone-acetaminophen (NORCO/VICODIN) 5-325 MG per tablet 1-2 tablet (2 tablets Oral Given 08/21/21 1927)  enoxaparin (LOVENOX) injection 40 mg (40 mg Subcutaneous Given 08/21/21 2325)  docusate sodium (COLACE) capsule 100 mg (100 mg Oral Given 08/21/21 2325)  bisacodyl (DULCOLAX) EC tablet 5 mg (has no administration in time range)  0.9 %  sodium chloride infusion ( Intravenous New Bag/Given 08/21/21 2324)  ceFAZolin (ANCEF) IVPB 2g/100 mL premix (has no administration in time range)  sodium chloride 0.9 % bolus 500 mL (0 mLs Intravenous Stopped 08/21/21 1742)  fentaNYL (SUBLIMAZE) injection 50 mcg (50 mcg Intravenous Given 08/21/21 1714)    Critical Care performed: No   ____________________________________________   INITIAL IMPRESSION / ASSESSMENT AND PLAN /  ED COURSE  Pertinent labs & imaging results that were available during my care of the patient were reviewed by me and considered in my medical decision making (see chart for details).     ARBOR BOLOSAN is a 87 y.o. male, unclear medical history, who presents to the emergency department for evaluation of right hip following a fall that occurred 2 days prior.   On exam, patient appears unwell but in good spirits.  He has significant tenderness along his right hip/proximal femur region with notable shortness of the leg and internal rotation. Significant skin tear along the right forearm with old gauze dressing tightly adhered, unable to be removed on initial exam.   CBC shows leukocytosis at 14.3. CMP shows mildly elevated creatinine, otherwise unremarkable.  CK elevated at 493, but not consistent with rhabdomyolysis.    X-ray imaging shows intertrochanteric fracture of the proximal femur with rotation and impaction.  Consulted orthopedic surgery, who will plan to operate when available.   Gauze dressing on the right forearm was removed with some difficulty due to significant adherence to the tissue.  Wound was cleaned and redressed with sterile gauze.  During redressing of the wound, the patient's grandson arrived.  He explained that the patient lives alone, but is checked on 1-2 times per week by him and another family member.  He states that the patient is only being followed by a podiatrist, and that he refuses to establish with a primary care provider for unknown reasons.  Consulted with the hospitalist, who plans to assume care of the patient until he can have his surgery. We will plan to admit the patient.          ____________________________________________   FINAL CLINICAL IMPRESSION(S) / ED DIAGNOSES  Final diagnoses:  Fall     NEW MEDICATIONS STARTED DURING THIS VISIT:  ED  Discharge Orders     None        Note:  This document was prepared using Dragon voice recognition software and may include unintentional dictation errors.    Teodoro Spray, [place] 08/22/21 [id number]    Naaman Plummer, MD 08/22/21 [phone]

## 2021-08-21 NOTE — ED Notes (Signed)
RN notified APS at this time.

## 2021-08-21 NOTE — ED Notes (Signed)
Inetta Fermo with APS called RN back and notified her to the findings of the pt.

## 2021-08-21 NOTE — ED Triage Notes (Signed)
Pt arrived via EMS from home. Pt fell on Monday in a parking lot and refused to come to the hospital. Pt was put in his bed at home and has been urinating and having BM's in his bed as well as family giving pt meals as stated by EMS. Pt is currently soaked in his own urine and smells of strong body odor and urine. Pt has a shortened and externally rotated right leg with a deformity top the right hip. Pulses are present as well as sensation. Pt A/Ox4, NAD

## 2021-08-22 ENCOUNTER — Other Ambulatory Visit: Payer: Self-pay

## 2021-08-22 ENCOUNTER — Inpatient Hospital Stay: Payer: Medicare Other | Admitting: Anesthesiology

## 2021-08-22 ENCOUNTER — Encounter
Admission: EM | Disposition: A | Discharge status: Skilled Nursing Facility | Payer: Self-pay | Source: Home / Self Care | Attending: Internal Medicine

## 2021-08-22 ENCOUNTER — Encounter: Payer: Self-pay | Admitting: Family Medicine

## 2021-08-22 ENCOUNTER — Inpatient Hospital Stay: Payer: Medicare Other

## 2021-08-22 DIAGNOSIS — S7291XD Unspecified fracture of right femur, subsequent encounter for closed fracture with routine healing: Secondary | ICD-10-CM

## 2021-08-22 DIAGNOSIS — L8990 Pressure ulcer of unspecified site, unspecified stage: Secondary | ICD-10-CM | POA: Insufficient documentation

## 2021-08-22 HISTORY — PX: INTRAMEDULLARY (IM) NAIL INTERTROCHANTERIC: SHX5875

## 2021-08-22 LAB — BASIC METABOLIC PANEL
Anion gap: 7 (ref 5–15)
BUN: 39 mg/dL — ABNORMAL HIGH (ref 8–23)
CO2: 21 mmol/L — ABNORMAL LOW (ref 22–32)
Calcium: 7.9 mg/dL — ABNORMAL LOW (ref 8.9–10.3)
Chloride: 110 mmol/L (ref 98–111)
Creatinine, Ser: 1.51 mg/dL — ABNORMAL HIGH (ref 0.61–1.24)
GFR, Estimated: 44 mL/min — ABNORMAL LOW (ref >60)
Glucose, Bld: 106 mg/dL — ABNORMAL HIGH (ref 70–99)
Potassium: 3.8 mmol/L (ref 3.5–5.1)
Sodium: 138 mmol/L (ref 135–145)

## 2021-08-22 LAB — VITAMIN D 25 HYDROXY (VIT D DEFICIENCY, FRACTURES): Vit D, 25-Hydroxy: 9.87 ng/mL — ABNORMAL LOW (ref 30–100)

## 2021-08-22 LAB — PHOSPHORUS: Phosphorus: 3.6 mg/dL (ref 2.5–4.6)

## 2021-08-22 LAB — CBC
HCT: 26.2 % — ABNORMAL LOW (ref 39.0–52.0)
Hemoglobin: 8.7 g/dL — ABNORMAL LOW (ref 13.0–17.0)
MCH: 32.1 pg (ref 26.0–34.0)
MCHC: 33.2 g/dL (ref 30.0–36.0)
MCV: 96.7 fL (ref 80.0–100.0)
Platelets: 176 10*3/uL (ref 150–400)
RBC: 2.71 MIL/uL — ABNORMAL LOW (ref 4.22–5.81)
RDW: 14.1 % (ref 11.5–15.5)
WBC: 11.2 10*3/uL — ABNORMAL HIGH (ref 4.0–10.5)
nRBC: 0 % (ref 0.0–0.2)

## 2021-08-22 LAB — CK: Total CK: 352 U/L (ref 49–397)

## 2021-08-22 LAB — MAGNESIUM: Magnesium: 2.2 mg/dL (ref 1.7–2.4)

## 2021-08-22 SURGERY — FIXATION, FRACTURE, INTERTROCHANTERIC, WITH INTRAMEDULLARY ROD
Anesthesia: General | Site: Hip | Laterality: Right

## 2021-08-22 MED ORDER — POLYETHYLENE GLYCOL 3350 17 G PO PACK: 17.0000 g | PACK | Freq: Every day | ORAL | Status: DC | PRN

## 2021-08-22 MED ORDER — FENTANYL CITRATE (PF) 100 MCG/2ML IJ SOLN
INTRAMUSCULAR | Status: AC
  Filled 2021-08-22: qty 2

## 2021-08-22 MED ORDER — ACETAMINOPHEN 10 MG/ML IV SOLN
INTRAVENOUS | Status: AC
  Filled 2021-08-22: qty 100

## 2021-08-22 MED ORDER — BUPIVACAINE HCL (PF) 0.5 % IJ SOLN
INTRAMUSCULAR | Status: DC | PRN
  Administered 2021-08-22: 2.6 mL

## 2021-08-22 MED ORDER — BACITRACIN ZINC 500 UNIT/GM EX OINT
TOPICAL_OINTMENT | CUTANEOUS | Status: AC
  Filled 2021-08-22: qty 28.35

## 2021-08-22 MED ORDER — KETAMINE HCL 50 MG/5ML IJ SOSY
PREFILLED_SYRINGE | INTRAMUSCULAR | Status: AC
  Filled 2021-08-22: qty 5

## 2021-08-22 MED ORDER — ACETAMINOPHEN 500 MG PO TABS
500.0000 mg | ORAL_TABLET | Freq: Four times a day (QID) | ORAL | Status: AC
  Administered 2021-08-23 (×3): 500 mg via ORAL
  Filled 2021-08-22 (×4): qty 1

## 2021-08-22 MED ORDER — EPHEDRINE 5 MG/ML INJ
INTRAVENOUS | Status: AC
  Filled 2021-08-22: qty 5

## 2021-08-22 MED ORDER — ENSURE ENLIVE PO LIQD
237.0000 mL | Freq: Two times a day (BID) | ORAL | Status: DC
  Administered 2021-08-23 – 2021-08-27 (×5): 237 mL via ORAL
  Filled 2021-08-22 (×2): qty 237

## 2021-08-22 MED ORDER — PROPOFOL 10 MG/ML IV BOLUS
INTRAVENOUS | Status: DC | PRN
  Administered 2021-08-22: 20 mg via INTRAVENOUS

## 2021-08-22 MED ORDER — METHOCARBAMOL 1000 MG/10ML IJ SOLN
500.0000 mg | Freq: Four times a day (QID) | INTRAVENOUS | Status: DC | PRN
  Filled 2021-08-22: qty 5

## 2021-08-22 MED ORDER — ONDANSETRON HCL 4 MG/2ML IJ SOLN: 4.0000 mg | Freq: Four times a day (QID) | INTRAMUSCULAR | Status: DC | PRN

## 2021-08-22 MED ORDER — CEFAZOLIN SODIUM-DEXTROSE 2-4 GM/100ML-% IV SOLN
2.0000 g | Freq: Four times a day (QID) | INTRAVENOUS | Status: AC
  Administered 2021-08-22 – 2021-08-23 (×2): 2 g via INTRAVENOUS
  Filled 2021-08-22 (×2): qty 100

## 2021-08-22 MED ORDER — PROPOFOL 500 MG/50ML IV EMUL
INTRAVENOUS | Status: DC | PRN
  Administered 2021-08-22: 50 ug/kg/min via INTRAVENOUS

## 2021-08-22 MED ORDER — SODIUM CHLORIDE 0.9 % IR SOLN
Status: DC | PRN
  Administered 2021-08-22: 13:00:00 251 mL

## 2021-08-22 MED ORDER — OXYCODONE HCL 5 MG PO TABS: 5.0000 mg | ORAL_TABLET | Freq: Once | ORAL | Status: DC | PRN

## 2021-08-22 MED ORDER — SENNA 8.6 MG PO TABS
1.0000 | ORAL_TABLET | Freq: Two times a day (BID) | ORAL | Status: DC
  Administered 2021-08-22 – 2021-08-23 (×3): 8.6 mg via ORAL
  Filled 2021-08-22 (×3): qty 1

## 2021-08-22 MED ORDER — FENTANYL CITRATE (PF) 100 MCG/2ML IJ SOLN: 25.0000 ug | INTRAMUSCULAR | Status: DC | PRN

## 2021-08-22 MED ORDER — KETAMINE HCL 10 MG/ML IJ SOLN
INTRAMUSCULAR | Status: DC | PRN
  Administered 2021-08-22: 20 mg via INTRAVENOUS

## 2021-08-22 MED ORDER — PHENYLEPHRINE HCL-NACL 20-0.9 MG/250ML-% IV SOLN
INTRAVENOUS | Status: AC
  Filled 2021-08-22: qty 250

## 2021-08-22 MED ORDER — TRAMADOL HCL 50 MG PO TABS
50.0000 mg | ORAL_TABLET | Freq: Four times a day (QID) | ORAL | Status: DC
  Administered 2021-08-22 – 2021-08-27 (×16): 50 mg via ORAL
  Filled 2021-08-22 (×19): qty 1

## 2021-08-22 MED ORDER — ADULT MULTIVITAMIN W/MINERALS CH
1.0000 | ORAL_TABLET | Freq: Every day | ORAL | Status: DC
  Administered 2021-08-23 – 2021-08-27 (×5): 1 via ORAL
  Filled 2021-08-22 (×6): qty 1

## 2021-08-22 MED ORDER — ACETAMINOPHEN 10 MG/ML IV SOLN
INTRAVENOUS | Status: DC | PRN
  Administered 2021-08-22: 1000 mg via INTRAVENOUS

## 2021-08-22 MED ORDER — PHENYLEPHRINE HCL-NACL 20-0.9 MG/250ML-% IV SOLN
INTRAVENOUS | Status: DC | PRN
  Administered 2021-08-22: 50 ug/min via INTRAVENOUS

## 2021-08-22 MED ORDER — ENOXAPARIN SODIUM 40 MG/0.4ML IJ SOSY
40.0000 mg | PREFILLED_SYRINGE | INTRAMUSCULAR | Status: DC
  Administered 2021-08-23 – 2021-08-27 (×3): 40 mg via SUBCUTANEOUS
  Filled 2021-08-22 (×5): qty 0.4

## 2021-08-22 MED ORDER — CHLORHEXIDINE GLUCONATE 0.12 % MT SOLN: 15.0000 mL | Freq: Once | OROMUCOSAL | Status: AC

## 2021-08-22 MED ORDER — CHLORHEXIDINE GLUCONATE CLOTH 2 % EX PADS
6.0000 | MEDICATED_PAD | Freq: Every day | CUTANEOUS | Status: DC
  Administered 2021-08-22 – 2021-08-27 (×3): 6 via TOPICAL

## 2021-08-22 MED ORDER — PROPOFOL 1000 MG/100ML IV EMUL
INTRAVENOUS | Status: AC
  Filled 2021-08-22: qty 200

## 2021-08-22 MED ORDER — SUCCINYLCHOLINE CHLORIDE 200 MG/10ML IV SOSY
PREFILLED_SYRINGE | INTRAVENOUS | Status: AC
  Filled 2021-08-22: qty 10

## 2021-08-22 MED ORDER — MORPHINE SULFATE (PF) 2 MG/ML IV SOLN
0.5000 mg | INTRAVENOUS | Status: DC | PRN
  Filled 2021-08-22: qty 1

## 2021-08-22 MED ORDER — ONDANSETRON HCL 4 MG/2ML IJ SOLN: 4.0000 mg | Freq: Once | INTRAMUSCULAR | Status: DC | PRN

## 2021-08-22 MED ORDER — GENTAMICIN SULFATE 40 MG/ML IJ SOLN
INTRAMUSCULAR | Status: AC
  Filled 2021-08-22: qty 2

## 2021-08-22 MED ORDER — EPHEDRINE SULFATE 50 MG/ML IJ SOLN
INTRAMUSCULAR | Status: DC | PRN
  Administered 2021-08-22: 10 mg via INTRAVENOUS

## 2021-08-22 MED ORDER — LACTATED RINGERS IV SOLN: INTRAVENOUS | Status: DC

## 2021-08-22 MED ORDER — ORAL CARE MOUTH RINSE: 15.0000 mL | Freq: Once | OROMUCOSAL | Status: AC

## 2021-08-22 MED ORDER — OXYCODONE HCL 5 MG/5ML PO SOLN: 5.0000 mg | Freq: Once | ORAL | Status: DC | PRN

## 2021-08-22 MED ORDER — ACETAMINOPHEN 10 MG/ML IV SOLN: 1000.0000 mg | Freq: Once | INTRAVENOUS | Status: DC | PRN

## 2021-08-22 MED ORDER — FENTANYL CITRATE (PF) 100 MCG/2ML IJ SOLN
INTRAMUSCULAR | Status: DC | PRN
  Administered 2021-08-22: 50 ug via INTRAVENOUS

## 2021-08-22 MED ORDER — METHOCARBAMOL 500 MG PO TABS: 500.0000 mg | ORAL_TABLET | Freq: Four times a day (QID) | ORAL | Status: DC | PRN

## 2021-08-22 MED ORDER — BISACODYL 10 MG RE SUPP: 10.0000 mg | Freq: Every day | RECTAL | Status: DC | PRN

## 2021-08-22 MED ORDER — ONDANSETRON HCL 4 MG PO TABS: 4.0000 mg | ORAL_TABLET | Freq: Four times a day (QID) | ORAL | Status: DC | PRN

## 2021-08-22 MED ORDER — BUPIVACAINE HCL (PF) 0.5 % IJ SOLN
INTRAMUSCULAR | Status: AC
  Filled 2021-08-22: qty 10

## 2021-08-22 MED ORDER — 0.9 % SODIUM CHLORIDE (POUR BTL) OPTIME
TOPICAL | Status: DC | PRN
  Administered 2021-08-22: 13:00:00 1000 mL

## 2021-08-22 MED ORDER — ALUM & MAG HYDROXIDE-SIMETH 200-200-20 MG/5ML PO SUSP: 30.0000 mL | ORAL | Status: DC | PRN

## 2021-08-22 MED ORDER — ASPIRIN 81 MG PO CHEW
81.0000 mg | CHEWABLE_TABLET | Freq: Every day | ORAL | Status: DC
  Administered 2021-08-22 – 2021-08-27 (×6): 81 mg via ORAL
  Filled 2021-08-22 (×6): qty 1

## 2021-08-22 MED ORDER — CHLORHEXIDINE GLUCONATE 0.12 % MT SOLN
OROMUCOSAL | Status: AC
  Administered 2021-08-22: 11:00:00 15 mL via OROMUCOSAL
  Filled 2021-08-22: qty 15

## 2021-08-22 MED ORDER — PHENYLEPHRINE HCL (PRESSORS) 10 MG/ML IV SOLN
INTRAVENOUS | Status: DC | PRN
  Administered 2021-08-22: 240 ug via INTRAVENOUS

## 2021-08-22 SURGICAL SUPPLY — 48 items
BIT DRILL 4.3MMS DISTAL GRDTED (BIT) ×1 IMPLANT
BNDG COHESIVE 6X5 TAN ST LF (GAUZE/BANDAGES/DRESSINGS) ×4 IMPLANT
CORTICAL BONE SCR 5.0MM X 46MM (Screw) ×2 IMPLANT
DRAPE 3/4 80X56 (DRAPES) ×4 IMPLANT
DRAPE SURG 17X11 SM STRL (DRAPES) ×4 IMPLANT
DRAPE U-SHAPE 47X51 STRL (DRAPES) ×2 IMPLANT
DRILL 4.3MMS DISTAL GRADUATED (BIT) ×2
DRSG OPSITE POSTOP 3X4 (GAUZE/BANDAGES/DRESSINGS) ×4 IMPLANT
DRSG OPSITE POSTOP 4X14 (GAUZE/BANDAGES/DRESSINGS) IMPLANT
DRSG OPSITE POSTOP 4X6 (GAUZE/BANDAGES/DRESSINGS) ×2 IMPLANT
DRSG TEGADERM 4X4.75 (GAUZE/BANDAGES/DRESSINGS) ×12 IMPLANT
DRSG TELFA 3X8 NADH (GAUZE/BANDAGES/DRESSINGS) ×8 IMPLANT
DURAPREP 26ML APPLICATOR (WOUND CARE) ×4 IMPLANT
ELECT REM PT RETURN 9FT ADLT (ELECTROSURGICAL) ×2
ELECTRODE REM PT RTRN 9FT ADLT (ELECTROSURGICAL) ×1 IMPLANT
GAUZE 4X4 16PLY ~~LOC~~+RFID DBL (SPONGE) IMPLANT
GLOVE SURG ORTHO LTX SZ9 (GLOVE) ×4 IMPLANT
GLOVE SURG UNDER POLY LF SZ9 (GLOVE) ×2 IMPLANT
GOWN STRL REUS TWL 2XL XL LVL4 (GOWN DISPOSABLE) ×2 IMPLANT
GOWN STRL REUS W/ TWL LRG LVL3 (GOWN DISPOSABLE) ×1 IMPLANT
GOWN STRL REUS W/TWL LRG LVL3 (GOWN DISPOSABLE) ×1
GUIDEPIN VERSANAIL DSP 3.2X444 (ORTHOPEDIC DISPOSABLE SUPPLIES) ×2 IMPLANT
GUIDEWIRE BALL NOSE 100CM (WIRE) ×2 IMPLANT
HEMOVAC 400CC 10FR (MISCELLANEOUS) IMPLANT
HFN RH 130 DEG 11MM X 420MM (Nail) ×2 IMPLANT
HOLDER FOLEY CATH W/STRAP (MISCELLANEOUS) ×2 IMPLANT
KIT TURNOVER CYSTO (KITS) ×2 IMPLANT
MANIFOLD NEPTUNE II (INSTRUMENTS) ×2 IMPLANT
MAT ABSORB  FLUID 56X50 GRAY (MISCELLANEOUS) ×1
MAT ABSORB FLUID 56X50 GRAY (MISCELLANEOUS) ×1 IMPLANT
NS IRRIG 1000ML POUR BTL (IV SOLUTION) ×2 IMPLANT
PACK HIP COMPR (MISCELLANEOUS) ×2 IMPLANT
PAD ARMBOARD 7.5X6 YLW CONV (MISCELLANEOUS) ×2 IMPLANT
SCREW BONE CORTICAL 5.0X50 (Screw) ×2 IMPLANT
SCREW CORTICL BON 5.0MM X 46MM (Screw) ×1 IMPLANT
SCREW LAG 10.5MMX105MM HFN (Screw) ×2 IMPLANT
SCREWDRIVER HEX TIP 3.5MM (MISCELLANEOUS) ×2 IMPLANT
SPONGE T-LAP 18X18 ~~LOC~~+RFID (SPONGE) ×4 IMPLANT
STAPLER SKIN PROX 35W (STAPLE) ×2 IMPLANT
SUCTION FRAZIER HANDLE 10FR (MISCELLANEOUS) ×1
SUCTION TUBE FRAZIER 10FR DISP (MISCELLANEOUS) ×1 IMPLANT
SUT VIC AB 0 CT1 36 (SUTURE) ×4 IMPLANT
SUT VIC AB 2-0 CT1 27 (SUTURE) ×1
SUT VIC AB 2-0 CT1 TAPERPNT 27 (SUTURE) ×1 IMPLANT
SUT VICRYL 0 AB UR-6 (SUTURE) ×2 IMPLANT
SYR 30ML LL (SYRINGE) ×2 IMPLANT
TRAY FOLEY MTR SLVR 16FR STAT (SET/KITS/TRAYS/PACK) ×2 IMPLANT
WATER STERILE IRR 500ML POUR (IV SOLUTION) ×2 IMPLANT

## 2021-08-22 NOTE — Progress Notes (Signed)
Subjective:  POST OP CHECK s/p intramedullary fixation for right intertrochanteric hip fracture.  Patient seen in his hospital room with his grandson at the bedside.  Patient reports right hip pain as mild.    Objective:   VITALS:   Vitals:   08/22/21 1511 08/22/21 1515 08/22/21 1530 08/22/21 1608  BP: 127/61 123/62 139/65 135/69  Pulse: 69 69 68 78  Resp: 10 16 14 18   Temp:   (!) 97.4 F (36.3 C) (!) 96.7 F (35.9 C)  TempSrc:    Axillary  SpO2: 98% 96% 99% 100%  Weight:      Height:        PHYSICAL EXAM: Right lower extremity: Neurovascular intact Sensation intact distally Intact pulses distally Dorsiflexion/Plantar flexion intact Incision: dressing C/D/I No cellulitis present Compartment soft  LABS  Results for orders placed or performed during the hospital encounter of 08/21/21 (from the past 24 hour(s))  Comprehensive metabolic panel     Status: Abnormal   Collection Time: 08/21/21  5:16 PM  Result Value Ref Range   Sodium 138 135 - 145 mmol/L   Potassium 3.8 3.5 - 5.1 mmol/L   Chloride 108 98 - 111 mmol/L   CO2 22 22 - 32 mmol/L   Glucose, Bld 118 (H) 70 - 99 mg/dL   BUN 34 (H) 8 - 23 mg/dL   Creatinine, Ser 08/23/21 (H) 0.61 - 1.24 mg/dL   Calcium 8.6 (L) 8.9 - 10.3 mg/dL   Total Protein 7.6 6.5 - 8.1 g/dL   Albumin 3.9 3.5 - 5.0 g/dL   AST 25 15 - 41 U/L   ALT 10 0 - 44 U/L   Alkaline Phosphatase 84 38 - 126 U/L   Total Bilirubin 1.3 (H) 0.3 - 1.2 mg/dL   GFR, Estimated 41 (L) >60 mL/min   Anion gap 8 5 - 15  CBC with Differential     Status: Abnormal   Collection Time: 08/21/21  5:16 PM  Result Value Ref Range   WBC 14.3 (H) 4.0 - 10.5 K/uL   RBC 3.24 (L) 4.22 - 5.81 MIL/uL   Hemoglobin 10.6 (L) 13.0 - 17.0 g/dL   HCT 08/23/21 (L) 14.4 - 81.8 %   MCV 97.8 80.0 - 100.0 fL   MCH 32.7 26.0 - 34.0 pg   MCHC 33.4 30.0 - 36.0 g/dL   RDW 56.3 14.9 - 70.2 %   Platelets 206 150 - 400 K/uL   nRBC 0.0 0.0 - 0.2 %   Neutrophils Relative % 83 %   Neutro Abs  11.8 (H) 1.7 - 7.7 K/uL   Lymphocytes Relative 9 %   Lymphs Abs 1.3 0.7 - 4.0 K/uL   Monocytes Relative 7 %   Monocytes Absolute 1.0 0.1 - 1.0 K/uL   Eosinophils Relative 0 %   Eosinophils Absolute 0.0 0.0 - 0.5 K/uL   Basophils Relative 0 %   Basophils Absolute 0.1 0.0 - 0.1 K/uL   Immature Granulocytes 1 %   Abs Immature Granulocytes 0.11 (H) 0.00 - 0.07 K/uL  CK     Status: Abnormal   Collection Time: 08/21/21  5:16 PM  Result Value Ref Range   Total CK 493 (H) 49 - 397 U/L  Resp Panel by RT-PCR (Flu A&B, Covid) Nasopharyngeal Swab     Status: None   Collection Time: 08/21/21  5:16 PM   Specimen: Nasopharyngeal Swab; Nasopharyngeal(NP) swabs in vial transport medium  Result Value Ref Range   SARS Coronavirus 2 by RT PCR NEGATIVE  NEGATIVE   Influenza A by PCR NEGATIVE NEGATIVE   Influenza B by PCR NEGATIVE NEGATIVE  VITAMIN D 25 Hydroxy (Vit-D Deficiency, Fractures)     Status: Abnormal   Collection Time: 08/21/21 11:17 PM  Result Value Ref Range   Vit D, 25-Hydroxy 9.87 (L) 30 - 100 ng/mL  CBC     Status: Abnormal   Collection Time: 08/22/21  5:47 AM  Result Value Ref Range   WBC 11.2 (H) 4.0 - 10.5 K/uL   RBC 2.71 (L) 4.22 - 5.81 MIL/uL   Hemoglobin 8.7 (L) 13.0 - 17.0 g/dL   HCT 11.1 (L) 55.2 - 08.0 %   MCV 96.7 80.0 - 100.0 fL   MCH 32.1 26.0 - 34.0 pg   MCHC 33.2 30.0 - 36.0 g/dL   RDW 22.3 36.1 - 22.4 %   Platelets 176 150 - 400 K/uL   nRBC 0.0 0.0 - 0.2 %  CK     Status: None   Collection Time: 08/22/21  5:47 AM  Result Value Ref Range   Total CK 352 49 - 397 U/L  Basic metabolic panel     Status: Abnormal   Collection Time: 08/22/21  5:47 AM  Result Value Ref Range   Sodium 138 135 - 145 mmol/L   Potassium 3.8 3.5 - 5.1 mmol/L   Chloride 110 98 - 111 mmol/L   CO2 21 (L) 22 - 32 mmol/L   Glucose, Bld 106 (H) 70 - 99 mg/dL   BUN 39 (H) 8 - 23 mg/dL   Creatinine, Ser 4.97 (H) 0.61 - 1.24 mg/dL   Calcium 7.9 (L) 8.9 - 10.3 mg/dL   GFR, Estimated 44 (L) >60  mL/min   Anion gap 7 5 - 15  Magnesium     Status: None   Collection Time: 08/22/21  5:47 AM  Result Value Ref Range   Magnesium 2.2 1.7 - 2.4 mg/dL  Phosphorus     Status: None   Collection Time: 08/22/21  5:47 AM  Result Value Ref Range   Phosphorus 3.6 2.5 - 4.6 mg/dL  Type and screen [facility]     Status: None   Collection Time: 08/22/21 11:06 AM  Result Value Ref Range   ABO/RH(D) O POS    Antibody Screen NEG    Sample Expiration      08/25/2021,2359 Performed at [facility], [address], [place], [place] [address]     DG Chest 1 View  Result Date: 08/21/2021 CLINICAL DATA:  Status post fall with subsequent right hip pain. EXAM: CHEST  1 VIEW COMPARISON:  None. FINDINGS: The heart size and mediastinal contours are within normal limits. Both lungs are clear. Degenerative changes seen involving the bilateral shoulders and thoracic spine. IMPRESSION: No active disease. Electronically Signed   By: Aram Candela M.D.   On: 08/21/2021 17:54   DG Pelvis 1-2 Views  Result Date: 08/21/2021 CLINICAL DATA:  Right-sided hip and thigh pain following fall EXAM: PELVIS - 1 VIEW; RIGHT FEMUR 2 VIEWS COMPARISON:  None. FINDINGS: Intertrochanteric fracture of the right femur, with rotation and impaction. No other fracture is seen in the pelvis or femur. Severe degenerative changes at the knee. Degenerative changes are also noted in the bilateral acetabula, sacroiliac joints, and lower lumbar spine. IMPRESSION: Intertrochanteric fracture of the right femur. Electronically Signed   By: Wiliam Ke M.D.   On: 08/21/2021 17:55   DG C-Arm 1-60 Min-No Report  Result Date: 08/22/2021 Fluoroscopy was  utilized by the requesting physician.  No radiographic interpretation.   DG C-Arm 1-60 Min-No Report  Result Date: 08/22/2021 Fluoroscopy was utilized by the requesting physician.  No radiographic interpretation.   DG HIP UNILAT WITH PELVIS 2-3 VIEWS  RIGHT  Result Date: 08/22/2021 CLINICAL DATA:  Right hip intramedullary nail EXAM: DG HIP (WITH OR WITHOUT PELVIS) 2-3V RIGHT COMPARISON:  Pelvis radiographs obtained 1 day prior FINDINGS: Four C-arm fluoroscopic images were obtained intraoperatively and submitted for post operative interpretation. Images demonstrate interval intramedullary nail and screw fixation of the femur 4 fixation of a proximal femoral fracture. Hardware alignment appears within expected limits, without evidence of immediate complication. Degenerative changes are seen about the hip and knee. Please see the performing provider's procedural report for further detail. IMPRESSION: Interval intramedullary nail and screw fixation of the proximal right femoral fracture. Electronically Signed   By: Lesia Hausen M.D.   On: 08/22/2021 14:14   DG FEMUR, MIN 2 VIEWS RIGHT  Result Date: 08/22/2021 CLINICAL DATA:  Status post intramedullary rod fixation of right femur. EXAM: RIGHT FEMUR 2 VIEWS COMPARISON:  August 21, 2021. FINDINGS: Status post surgical internal fixation of proximal right femoral intertrochanteric fracture as well as intramedullary rod fixation of the right femur. Good alignment of fracture components is noted. Postsurgical changes are seen in the surrounding soft tissues. IMPRESSION: Status post surgical internal fixation of proximal right femoral intertrochanteric fracture with intramedullary rod fixation of the right femur. Electronically Signed   By: Lupita Raider M.D.   On: 08/22/2021 15:24   DG Femur Min 2 Views Right  Result Date: 08/21/2021 CLINICAL DATA:  Right-sided hip and thigh pain following fall EXAM: PELVIS - 1 VIEW; RIGHT FEMUR 2 VIEWS COMPARISON:  None. FINDINGS: Intertrochanteric fracture of the right femur, with rotation and impaction. No other fracture is seen in the pelvis or femur. Severe degenerative changes at the knee. Degenerative changes are also noted in the bilateral acetabula, sacroiliac  joints, and lower lumbar spine. IMPRESSION: Intertrochanteric fracture of the right femur. Electronically Signed   By: Wiliam Ke M.D.   On: 08/21/2021 17:55    Assessment/Plan: Day of Surgery   Principal Problem:   Closed femur fracture [facility]) Active Problems:   Fall   AKI (acute kidney injury) (HCC)   Tobacco abuse  Patient stable postop.  Postop x-rays show the fracture is in good alignment.  The intramedullary hardware is well-positioned and without evidence of failure.  Attending current pain management.  Patient will begin physical and occupational therapy tomorrow.  Patient is weightbearing as tolerated on the right lower extremity.  She will begin Lovenox 40 mg daily for DVT prophylaxis and should continue this at his skilled nursing facility until follow-up with Dr. Martha Clan in 2 weeks following surgery.  Patient will complete 24 hours of postop antibiotics.  Labs will be rechecked in the morning.    Juanell Fairly , MD 08/22/2021, 4:39 PM

## 2021-08-22 NOTE — Anesthesia Preprocedure Evaluation (Addendum)
Anesthesia Evaluation    Airway Mallampati: II  TM Distance: >3 FB Neck ROM: Full    Dental  (+) Missing,  No upper teeth, three remaining teeth on lower arch in disrepair with significant gingival recession.  Discussed risk of tooth damage or loss extensively with the patient:   Pulmonary Current Smoker and Patient abstained from smoking.,    Pulmonary exam normal        Cardiovascular Exercise Tolerance: Poor hypertension, Normal cardiovascular exam     Neuro/Psych    GI/Hepatic   Endo/Other    Renal/GU Renal disease (AKI)     Musculoskeletal Right hip fracture s/p mechanical fall   Abdominal Normal abdominal exam  (+)   Peds  Hematology  (+) anemia ,   Anesthesia Other Findings  Patient uses a walker at baseline.  Patient is a smoker.  He is not on anticoagulation except for aspirin.  Patient has a history of Charcot-Marie-Tooth disease requiring lower extremity surgery when he was a teenager  Reproductive/Obstetrics                           Anesthesia Physical Anesthesia Plan  ASA: 2  Anesthesia Plan: General and Spinal   Post-op Pain Management:    Induction: Intravenous  PONV Risk Score and Plan: 1 and Propofol infusion, TIVA and Treatment may vary due to age or medical condition  Airway Management Planned: Natural Airway  Additional Equipment:   Intra-op Plan:   Post-operative Plan:   Informed Consent:   Plan Discussed with:   Anesthesia Plan Comments: (Pt received enoxaparin 40mg  at 23:25 on 08/21/21; can receive spinal 12 hours later.  Plan for spinal and GA with natural airway, LMA/GETA backup.  Patient consented for risks of anesthesia including but not limited to:  - adverse reactions to medications - damage to eyes, teeth, lips or other oral mucosa - nerve damage due to positioning  - sore throat or hoarseness - damage to heart, brain, nerves, lungs, other parts of  body or loss of life  Informed patient about role of CRNA in peri- and intra-operative care.  Patient voiced understanding.)       Anesthesia Quick Evaluation

## 2021-08-22 NOTE — Transfer of Care (Signed)
Immediate Anesthesia Transfer of Care Note  Patient: Keith Morris  Procedure(s) Performed: INTRAMEDULLARY (IM) NAIL INTERTROCHANTRIC (Right: Hip)  Patient Location: PACU  Anesthesia Type:General and Spinal  Level of Consciousness: drowsy and patient cooperative  Airway & Oxygen Therapy: Patient Spontanous Breathing  Post-op Assessment: Report given to RN and Post -op Vital signs reviewed and stable  Post vital signs: Reviewed and stable  Last Vitals:  Vitals Value Taken Time  BP 148/82 08/22/21 1421  Temp    Pulse 79 08/22/21 1427  Resp 24 08/22/21 1427  SpO2 95 % 08/22/21 1427  Vitals shown include unvalidated device data.  Last Pain:  Vitals:   08/22/21 1036  TempSrc: Temporal  PainSc: 0-No pain      Patients Stated Pain Goal: 0 (08/22/21 1036)  Complications: No notable events documented.

## 2021-08-22 NOTE — Progress Notes (Signed)
PROGRESS NOTE    Keith Morris  ZOX:[id number] DOB: Nov 21, 1931 DOA: 08/21/2021 PCP: Pcp, No    Brief Narrative:   87 y.o. male with PMH significant of tobacco abuse presented to the ED s/p mechanical fall in the parking lot outside the barbershop 2 days ago.  He denies any head trauma or head injury.  He denies any loss of consciousness, nausea or vomiting.  Patient has developed right thigh pain which was manageable initially at assisted home. Patient has been remained in bed for last 2 days,  unable to walk due to the pain in the right hip.  He has not been able to walk to eat or drink.  Patient was brought in the ED by his grandson.  Seen in consultation by orthopedics in the emergency department.  Discussed nature of fracture and recommended intramedullary fixation.  Patient n.p.o. and scheduled for surgery on 11/17.  Assessment & Plan:   Principal Problem:   Closed femur fracture [facility]) Active Problems:   Fall   AKI (acute kidney injury) (HCC)   Tobacco abuse  Right intertrochanteric femur fracture s/p mechanical fall Patient reported fall 2 days prior to presentation in the parking lot. Pain was initially manageable but has gotten worse. X-ray shows intertrochanteric fracture of right femur. Orthopedics consulted in ED Plan for IM fixation Plan: N.p.o. for OR with orthopedics today Pain control Therapy evaluations on postop day 1 Hold anticoagulation, DVT prophylaxis on postop day 1     Mild rhabdomyolysis CK4 93, consistent with right thigh pain Plan: IV hydration Monitor serum CK   AKI Suspect prerenal azotemia Plan: Continue IVF Avoid nephrotoxins Monitor kidney function    Leukocytosis Suspect reactive leukocytosis No clinical indication of infection   Tobacco abuse Counseling completed.   DVT prophylaxis: SCDs.  SQ Lovenox to start postop day #1 Code Status: Full Family Communication: Disposition Plan: Status is: Inpatient  Remains inpatient  appropriate because: A fracture.  Operative repair 11/17.  Anticipate need for skilled nursing facility placement.       Level of care: Med-Surg  Consultants:  Orthopedic surgery  Procedures:  IM nailing 11/17  Antimicrobials: None   Subjective: Seen and examined.  Resting comfortably.  Pain moderate  Objective: Vitals:   08/22/21 0100 08/22/21 0200 08/22/21 0300 08/22/21 0500  BP: 139/69 127/64 (!) 136/59 129/63  Pulse: 71 69 66 65  Resp: 16 12 12 10   Temp:      TempSrc:      SpO2: 98% 100% 100% 99%  Weight:       No intake or output data in the 24 hours ending 08/22/21 0628 Filed Weights   08/21/21 1627  Weight: 50 kg    Examination:  General exam: Appears calm and comfortable  Respiratory system: Clear to auscultation. Respiratory effort normal. Cardiovascular system: S1 & S2 heard, RRR. No JVD, murmurs, rubs, gallops or clicks. No pedal edema. Gastrointestinal system: Abdomen is nondistended, soft and nontender. No organomegaly or masses felt. Normal bowel sounds heard. Central nervous system: Alert and oriented. No focal neurological deficits. Extremities: Right lower extremity shortened and externally rotated.  Tenderness noted to right thigh.  Limited ROM Skin: Scattered ecchymoses both upper arms with skin tearing Psychiatry: Judgement and insight appear normal. Mood & affect appropriate.     Data Reviewed: I have personally reviewed following labs and imaging studies  CBC: Recent Labs  Lab 08/21/21 1716 08/22/21 0547  WBC 14.3* 11.2*  NEUTROABS 11.8*  --   HGB 10.6* 8.7*  HCT 31.7* 26.2*  MCV 97.8 96.7  PLT 206 176   Basic Metabolic Panel: Recent Labs  Lab 08/21/21 1716  NA 138  K 3.8  CL 108  CO2 22  GLUCOSE 118*  BUN 34*  CREATININE 1.60*  CALCIUM 8.6*   GFR: CrCl cannot be calculated (Unknown ideal weight.). Liver Function Tests: Recent Labs  Lab 08/21/21 1716  AST 25  ALT 10  ALKPHOS 84  BILITOT 1.3*  PROT 7.6   ALBUMIN 3.9   No results for input(s): LIPASE, AMYLASE in the last 168 hours. No results for input(s): AMMONIA in the last 168 hours. Coagulation Profile: No results for input(s): INR, PROTIME in the last 168 hours. Cardiac Enzymes: Recent Labs  Lab 08/21/21 1716  CKTOTAL 493*   BNP (last 3 results) No results for input(s): PROBNP in the last 8760 hours. HbA1C: No results for input(s): HGBA1C in the last 72 hours. CBG: No results for input(s): GLUCAP in the last 168 hours. Lipid Profile: No results for input(s): CHOL, HDL, LDLCALC, TRIG, CHOLHDL, LDLDIRECT in the last 72 hours. Thyroid Function Tests: No results for input(s): TSH, T4TOTAL, FREET4, T3FREE, THYROIDAB in the last 72 hours. Anemia Panel: No results for input(s): VITAMINB12, FOLATE, FERRITIN, TIBC, IRON, RETICCTPCT in the last 72 hours. Sepsis Labs: No results for input(s): PROCALCITON, LATICACIDVEN in the last 168 hours.  Recent Results (from the past 240 hour(s))  Resp Panel by RT-PCR (Flu A&B, Covid) Nasopharyngeal Swab     Status: None   Collection Time: 08/21/21  5:16 PM   Specimen: Nasopharyngeal Swab; Nasopharyngeal(NP) swabs in vial transport medium  Result Value Ref Range Status   SARS Coronavirus 2 by RT PCR NEGATIVE NEGATIVE Final    Comment: (NOTE) SARS-CoV-2 target nucleic acids are NOT DETECTED.  The SARS-CoV-2 RNA is generally detectable in upper respiratory specimens during the acute phase of infection. The lowest concentration of SARS-CoV-2 viral copies this assay can detect is 138 copies/mL. A negative result does not preclude SARS-Cov-2 infection and should not be used as the sole basis for treatment or other patient management decisions. A negative result may occur with  improper specimen collection/handling, submission of specimen other than nasopharyngeal swab, presence of viral mutation(s) within the areas targeted by this assay, and inadequate number of viral copies(<138 copies/mL). A  negative result must be combined with clinical observations, patient history, and epidemiological information. The expected result is Negative.  Fact Sheet for Patients:  BloggerCourse.com  Fact Sheet for Healthcare Providers:  SeriousBroker.it  This test is no t yet approved or cleared by the [place] FDA and  has been authorized for detection and/or diagnosis of SARS-CoV-2 by FDA under an Emergency Use Authorization (EUA). This EUA will remain  in effect (meaning this test can be used) for the duration of the COVID-19 declaration under Section 564(b)(1) of the Act, 21 U.S.C.section 360bbb-3(b)(1), unless the authorization is terminated  or revoked sooner.       Influenza A by PCR NEGATIVE NEGATIVE Final   Influenza B by PCR NEGATIVE NEGATIVE Final    Comment: (NOTE) The Xpert Xpress SARS-CoV-2/FLU/RSV plus assay is intended as an aid in the diagnosis of influenza from Nasopharyngeal swab specimens and should not be used as a sole basis for treatment. Nasal washings and aspirates are unacceptable for Xpert Xpress SARS-CoV-2/FLU/RSV testing.  Fact Sheet for Patients: BloggerCourse.com  Fact Sheet for Healthcare Providers: SeriousBroker.it  This test is not yet approved or cleared by the [place] FDA and has  been authorized for detection and/or diagnosis of SARS-CoV-2 by FDA under an Emergency Use Authorization (EUA). This EUA will remain in effect (meaning this test can be used) for the duration of the COVID-19 declaration under Section 564(b)(1) of the Act, 21 U.S.C. section 360bbb-3(b)(1), unless the authorization is terminated or revoked.  Performed at [facility], [address] [place], [place] [address]          Radiology Studies: DG Chest 1 View  Result Date: 08/21/2021 CLINICAL DATA:  Status post fall with subsequent right hip pain.  EXAM: CHEST  1 VIEW COMPARISON:  None. FINDINGS: The heart size and mediastinal contours are within normal limits. Both lungs are clear. Degenerative changes seen involving the bilateral shoulders and thoracic spine. IMPRESSION: No active disease. Electronically Signed   By: Aram Candela M.D.   On: 08/21/2021 17:54   DG Pelvis 1-2 Views  Result Date: 08/21/2021 CLINICAL DATA:  Right-sided hip and thigh pain following fall EXAM: PELVIS - 1 VIEW; RIGHT FEMUR 2 VIEWS COMPARISON:  None. FINDINGS: Intertrochanteric fracture of the right femur, with rotation and impaction. No other fracture is seen in the pelvis or femur. Severe degenerative changes at the knee. Degenerative changes are also noted in the bilateral acetabula, sacroiliac joints, and lower lumbar spine. IMPRESSION: Intertrochanteric fracture of the right femur. Electronically Signed   By: Wiliam Ke M.D.   On: 08/21/2021 17:55   DG Femur Min 2 Views Right  Result Date: 08/21/2021 CLINICAL DATA:  Right-sided hip and thigh pain following fall EXAM: PELVIS - 1 VIEW; RIGHT FEMUR 2 VIEWS COMPARISON:  None. FINDINGS: Intertrochanteric fracture of the right femur, with rotation and impaction. No other fracture is seen in the pelvis or femur. Severe degenerative changes at the knee. Degenerative changes are also noted in the bilateral acetabula, sacroiliac joints, and lower lumbar spine. IMPRESSION: Intertrochanteric fracture of the right femur. Electronically Signed   By: Wiliam Ke M.D.   On: 08/21/2021 17:55        Scheduled Meds:  docusate sodium  100 mg Oral BID   enoxaparin (LOVENOX) injection  40 mg Subcutaneous Q24H   Continuous Infusions:  sodium chloride 75 mL/hr at 08/21/21 2324    ceFAZolin (ANCEF) IV       LOS: 1 day    Time spent: 35 minutes    Tresa Moore, MD [facility]   If 7PM-7AM, please contact night-coverage  08/22/2021, 6:28 AM

## 2021-08-22 NOTE — Progress Notes (Signed)
The patient has been re-examined, and the chart reviewed, and there have been no interval changes to the documented history and physical.    The risks, benefits, and alternatives have been discussed at length, and the patient is willing to proceed.     The right hip was marked according to hospital's correct site of surgery protocol.

## 2021-08-22 NOTE — Progress Notes (Signed)
Initial Nutrition Assessment  DOCUMENTATION CODES:   Not applicable  INTERVENTION:   -Once diet is advanced, add:   -Ensure Enlive po BID, each supplement provides 350 kcal and 20 grams of protein  -MVI with minerals daily  NUTRITION DIAGNOSIS:   Increased nutrient needs related to post-op healing as evidenced by estimated needs.  GOAL:   Patient will meet greater than or equal to 90% of their needs  MONITOR:   PO intake, Supplement acceptance, Diet advancement, Weight trends, Labs, Skin, I & O's  REASON FOR ASSESSMENT:   Consult Assessment of nutrition requirement/status, Hip fracture protocol  ASSESSMENT:   Keith Morris is a 87 y.o. male with PMH significant of tobacco abuse presented to the ED s/p mechanical fall in the parking lot outside the barbershop 2 days ago.  He denies any head trauma or head injury.  He denies any loss of consciousness, nausea or vomiting.  Patient has developed right thigh pain which was manageable initially at assisted home. Patient has been remained in bed for last 2 days,  unable to walk due to the pain in the right hip.  He has not been able to walk to eat or drink.  Patient was brought in the ED by his grandson.  Patient has multiple bruises noted on his arms, grandson reported he has tendency to hit his hands on the [place].  Pt admitted with intertochanteric femur fracture s/p mechanical fall.   Per orthopedics note,  plan for IMN today. Pt currently NPO for procedure.   Pt unavailable at time of visit. RD unable to obtain further nutrition-related history or complete nutrition-focused physical exam at this time.    Reviewed wt hx; pt has experienced a 7.1% wt loss over the past 15 months, which is not significant for time frame.   Medications reviewed and include colace and 0.9% sodium chloride infusion @ 75 ml/hr.   Labs reviewed.   Diet Order:   Diet Order             Diet NPO time specified  Diet effective midnight            Diet NPO time specified  Diet effective midnight                   EDUCATION NEEDS:   No education needs have been identified at this time  Skin:  Skin Assessment: Reviewed RN Assessment  Last BM:  Unknown  Height:   Ht Readings from Last 1 Encounters:  08/22/21 5\' 11"  (1.803 m)    Weight:   Wt Readings from Last 1 Encounters:  08/22/21 68 kg    Ideal Body Weight:  78.2 kg  BMI:  Body mass index is 20.92 kg/m.  Estimated Nutritional Needs:   Kcal:  1850-2050  Protein:  90-105 grams  Fluid:  > 1.8 L    08/24/21, RD, LDN, CDCES Registered Dietitian II Certified Diabetes Care and Education Specialist Please refer to [facility] for RD and/or RD on-call/weekend/after hours pager

## 2021-08-22 NOTE — Anesthesia Procedure Notes (Signed)
Spinal  Patient location during procedure: OR Start time: 08/22/2021 12:20 PM End time: 08/22/2021 12:35 PM Reason for block: surgical anesthesia Staffing Anesthesiologist: Johny Blamer, DO Resident/CRNA: Lynden Oxford, CRNA Preanesthetic Checklist Completed: patient identified, IV checked, site marked, risks and benefits discussed, surgical consent, monitors and equipment checked, pre-op evaluation and timeout performed Spinal Block Patient position: left lateral decubitus Prep: ChloraPrep Patient monitoring: heart rate, cardiac monitor, continuous pulse ox and blood pressure Approach: midline Location: L3-4 Injection technique: single-shot Needle Needle type: Pencan  Needle gauge: 25 G Needle length: 9 cm Assessment Sensory level: T4 Events: CSF return

## 2021-08-22 NOTE — Op Note (Signed)
08/22/2021  2:44 PM  PATIENT:  Keith Morris    PRE-OPERATIVE DIAGNOSIS:  Right Hip Fracture  POST-OPERATIVE DIAGNOSIS:  Same  PROCEDURE:  INTRAMEDULLARY (IM) NAIL FIXATION FOR RIGHT INTERTROCHANTERIC  SURGEON:  Juanell Fairly, MD  ANESTHESIA:   spinal  EBL:  100 cc  IMPLANT:  ZIMMER BIOMET AFFIXUS NAIL 71mm x with a 105 mm lag screw and distal interlocking screws 66mm  and 50 mm in length.  PREOPERATIVE INDICATIONS:  Keith Morris is a  87 y.o. male with a diagnosis of Right Hip Fracture status post fall.  Given the patient's displaced hip fracture I recommended intramedullary fixation.  The risks, benefits and alternatives were discussed with the patient and their family.  The risks include but are not limited to infection, bleeding requiring blood transfusion, nerve or blood vessel injury, malunion, nonunion, hardware prominence, hardware failure, leg length discrepancy or change in lower extremity rotation and need for further surgery including hardware removal with conversion to a total hip arthroplasty. Medical risks include but are not limited to DVT and pulmonary embolism, myocardial infarction, stroke, pneumonia, respiratory failure and death. The patient and their family understood these risks and wished to proceed with surgery.  OPERATIVE PROCEDURE:  The patient was brought to the operating room and placed in the supine position on the fracture table. The patient received placement of a spinal block.  A closed reduction was performed under C-arm guidance.  The fracture reduction was confirmed on both AP and lateral views. After adequate reduction was achieved, a time out was performed to verify the patient's name, date of birth, medical record number, correct site of surgery correct procedure to be performed. The timeout was also used to verify the patient received antibiotics and all appropriate instruments, implants and radiographic studies were available in the room.  Once all in attendance were in agreement, the case began. The patient was prepped and draped in a sterile fashion.  The patient received preoperative antibiotics with 2 g of Ancef IV.  To the incision.  An incision was made proximal to the greater trochanter in line with the femur. A guidewire was placed over the tip of the greater trochanter and advanced by drill into the proximal femur to the level of the lesser trochanter.  Confirmation of the drill pin position was made on AP and lateral C-arm images.  The threaded guidepin was then overdrilled with the proximal femoral entry reamer.  A ball-tipped guidewire was then advanced down the intramedullary canal, across the fracture, and down the femoral shaft to the knee.  The ball tip guidewire's position was confirmed at both the knee and hip via C-arm imaging. A depth gauge was used to measure the length of the long nail to be used. It was measured to be 420 mm.  The femoral canal was then prepared with sequential reamers up to 13 mm.  The actual nail was then inserted into the proximal femur, across the fracture site and down the femoral shaft. Its position was confirmed on AP and lateral C-arm images.  The ball tip guidewire was removed.  Once the nail was completely seated, the drill guide for the lag screw was placed through the guide arm for the Affixus nail. A guidepin was then placed through this drill guide and advanced through the lateral cortex of the femur, across the fracture site and into the femoral head achieving a tip apex distance of less than 25 mm. The length of the drill pin was  measured 105 mm, and then the drill for the lag screw was advanced through the lateral cortex, across the fracture site and up into the femoral head to the depth of the lag screw..  The lag screw was then advanced by hand into position across the fracture site into the femoral head. Its final position was confirmed on AP and lateral C-arm images. Compression was  applied as traction was carefully released. The set screw in the top of the intramedullary rod was tightened by hand using a screwdriver. It was backed off a quarter turn to allow for compression at the fracture site.  The attention was then turned to placement of the distal interlocking screws. A perfect circle technique was used. 2 small stab incisions were made over the distal interlocking screw holes.  A free hand technique was used to drill both distal interlocking screws. The depth of the screw holes was measured with a depth gauge. The 46 mm and 50 mm screws were then advanced into position and tightened by hand. Final C-arm images of the entire intramedullary construct were taken in both the AP and lateral planes.   The wounds were irrigated copiously and closed with 0 Vicryl for closure of the deep fascia and 2-0 Vicryl for subcutaneous closure. The skin was approximated with staples. A dry sterile dressing was applied. I was scrubbed and present the entire case and all sharp, sponge and instrument counts were correct at the conclusion of the case. Patient was transferred to a hospital bed and brought to PACU in stable condition.     Kathreen Devoid, MD

## 2021-08-23 ENCOUNTER — Encounter: Payer: Self-pay | Admitting: Orthopedic Surgery

## 2021-08-23 LAB — BASIC METABOLIC PANEL
Anion gap: 10 (ref 5–15)
BUN: 46 mg/dL — ABNORMAL HIGH (ref 8–23)
CO2: 17 mmol/L — ABNORMAL LOW (ref 22–32)
Calcium: 7.6 mg/dL — ABNORMAL LOW (ref 8.9–10.3)
Chloride: 108 mmol/L (ref 98–111)
Creatinine, Ser: 1.44 mg/dL — ABNORMAL HIGH (ref 0.61–1.24)
GFR, Estimated: 47 mL/min — ABNORMAL LOW (ref >60)
Glucose, Bld: 105 mg/dL — ABNORMAL HIGH (ref 70–99)
Potassium: 3.6 mmol/L (ref 3.5–5.1)
Sodium: 135 mmol/L (ref 135–145)

## 2021-08-23 LAB — CBC
HCT: 24 % — ABNORMAL LOW (ref 39.0–52.0)
Hemoglobin: 8.1 g/dL — ABNORMAL LOW (ref 13.0–17.0)
MCH: 32.3 pg (ref 26.0–34.0)
MCHC: 33.8 g/dL (ref 30.0–36.0)
MCV: 95.6 fL (ref 80.0–100.0)
Platelets: 192 10*3/uL (ref 150–400)
RBC: 2.51 MIL/uL — ABNORMAL LOW (ref 4.22–5.81)
RDW: 14 % (ref 11.5–15.5)
WBC: 13.3 10*3/uL — ABNORMAL HIGH (ref 4.0–10.5)
nRBC: 0 % (ref 0.0–0.2)

## 2021-08-23 NOTE — Progress Notes (Signed)
Physical Therapy Treatment Patient Details Name: Keith Morris MRN: [id number] DOB: [birth year] Today's Date: 08/23/2021   History of Present Illness Keith Morris is an 89yoM who comes [facility] on 11/16 after mechanical fall, imaging revealing of subsequent rt hip fracture. Pt returned to home with assistance and after 2 days of being unable to walk then came to the ED. PMH: tobacco use, 2 episodes of syncope in August 2021. pt went to OR on 11/17 c Dr. Scherry Ran for IM nail fixation Rt femur, then made WBAT.    PT Comments    Pt still up in chair at arrival, just got his cake and coffee, a modest self-selected lunch secondary to his large breakfast earlier. Pt remains quite pleasant, enthusiastic, ready to do whatever needed to maximize his potential. Pt educated on exercises seated in recliner and supine in bed. Pt Max+2 transfer from recliner to bed, then very minimal MinA return to supine. Pt sat up tall in bed as request, heels floated, bed trendeleburged to prevent anterior displacement of pelvis on bed. Student RN in room to assess vital signs once again.    Recommendations for follow up therapy are one component of a multi-disciplinary discharge planning process, led by the attending physician.  Recommendations may be updated based on patient status, additional functional criteria and insurance authorization.  Follow Up Recommendations  Skilled nursing-short term rehab (<3 hours/day)     Assistance Recommended at Discharge Intermittent Supervision/Assistance  Equipment Recommendations  None recommended by PT    Recommendations for Other Services       Precautions / Restrictions Precautions Precautions: Fall Restrictions Weight Bearing Restrictions: Yes RLE Weight Bearing: Weight bearing as tolerated     Mobility  Bed Mobility Overal bed mobility: Needs Assistance Bed Mobility: Sit to Supine       Sit to supine: Min assist   General bed mobility comments: wants to try  adn is able to do 75% of it, but struggles to fully get operativ eleg back into bed.    Transfers Overall transfer level: Needs assistance Equipment used: Rolling walker (2 wheels) Transfers: Bed to chair/wheelchair/BSC Sit to Stand: Max assist;+2 physical assistance Stand pivot transfers: Max assist;+2 physical assistance         General transfer comment: unable to help much despite desire    Ambulation/Gait Ambulation/Gait assistance:  (unable to at present)                 Social research officer, government Rankin (Stroke Patients Only)       Balance Overall balance assessment: Needs assistance Sitting-balance support: No upper extremity supported;Feet supported Sitting balance-Leahy Scale: Good     Standing balance support: Bilateral upper extremity supported Standing balance-Leahy Scale: Poor                              Cognition Arousal/Alertness: Awake/alert Behavior During Therapy: WFL for tasks assessed/performed Overall Cognitive Status: Within Functional Limits for tasks assessed                                          Exercises General Exercises - Upper Extremity Shoulder Flexion: AROM;Strengthening;Both;5 reps;Seated Shoulder Extension: AROM;Strengthening;Both;5 reps;Seated Elbow Flexion: AROM;Strengthening;Both;5 reps;Seated Elbow Extension: AROM;Strengthening;Both;5 reps;Seated General Exercises - Lower Extremity  Ankle Circles/Pumps: AROM;Strengthening;Both;5 reps;Seated Quad Sets: AROM;Right;15 reps;Supine Gluteal Sets: AROM;Strengthening;Both;5 reps;Seated Long Arc Quad: AROM;15 reps;Seated;Left;AAROM Heel Slides: AAROM;Left;15 reps;Supine Hip ABduction/ADduction: AROM;AAROM;Right;15 reps;Supine Other Exercises Other Exercises: Pt educated re; OT role, DME recs, d/c recs, falls prevention, HEP Other Exercises: LBD, self-feeding, sit<>stand x3, sitting/standing  balance/tolerance    General Comments        Pertinent Vitals/Pain Pain Assessment: No/denies pain    Home Living Family/patient expects to be discharged to:: Private residence Living Arrangements: Alone Available Help at Discharge: Family Type of Home: House Home Access: Stairs to enter   Secretary/administrator of Steps: 1   Home Layout: One level Home Equipment: Agricultural consultant (2 wheels);Rollator (4 wheels);Cane - single point Additional Comments: uses sink for washing 2/2 falls risk    Prior Function            PT Goals (current goals can now be found in the care plan section) Acute Rehab PT Goals Patient Stated Goal: return to baseline PT Goal Formulation: With patient Time For Goal Achievement: 09/06/21 Potential to Achieve Goals: Good Progress towards PT goals: Progressing toward goals    Frequency    7X/week      PT Plan Current plan remains appropriate    Co-evaluation              AM-PAC PT "6 Clicks" Mobility   Outcome Measure  Help needed turning from your back to your side while in a flat bed without using bedrails?: A Little Help needed moving from lying on your back to sitting on the side of a flat bed without using bedrails?: A Little Help needed moving to and from a bed to a chair (including a wheelchair)?: Total Help needed standing up from a chair using your arms (e.g., wheelchair or bedside chair)?: Total Help needed to walk in hospital room?: Total Help needed climbing 3-5 steps with a railing? : Total 6 Click Score: 10    End of Session   Activity Tolerance: Patient tolerated treatment well;Treatment limited secondary to medical complications (Comment) Patient left: with call bell/phone within reach;in bed;with nursing/sitter in room Nurse Communication: Mobility status PT Visit Diagnosis: Unsteadiness on feet (R26.81);Other abnormalities of gait and mobility (R26.89);Difficulty in walking, not elsewhere classified  (R26.2);Muscle weakness (generalized) (M62.81);History of falling (Z91.81)     Time: 4097-3532 PT Time Calculation (min) (ACUTE ONLY): 17 min  Charges:  $Therapeutic Exercise: 8-22 mins                    1:06 PM, 08/23/21 Rosamaria Lints, PT, DPT Physical Therapist - [facility]  [phone] (ASCOM)     Keith Morris C 08/23/2021, 1:03 PM

## 2021-08-23 NOTE — Evaluation (Signed)
Occupational Therapy Evaluation Patient Details Name: Keith Morris MRN: [id number] DOB: 06-21-1932 Today's Date: 08/23/2021   History of Present Illness Keith Morris is an 89yoM who comes to [facility] on 11/16 after mechanical fall, imaging revealing of subsequent rt hip fracture. Keith Morris returned to home with assistance and after 2 days of being unable to walk then came to the ED. PMH: tobacco use, 2 episodes of syncope in August 2021. Keith Morris went to OR on 11/17 c Dr. Scherry Ran for IM nail fixation Rt femur, then made WBAT.   Clinical Impression   Keith Morris was seen for OT evaluation this date. Prior to hospital admission, Keith Morris was MOD I for mobility and ADLs, reports falls hx. Keith Morris lives with family available PRN. Keith Morris presents to acute OT demonstrating impaired ADL performance and functional mobility 2/2 decreased activity tolerance and functional strength/ROM/balance deficits. Keith Morris currently requires SETUP self-feeding seated in chair. MAX A x2 for sit<>stand x3 from low chair height. Initial 2 trials Keith Morris used RW and unable to achieve upright posture r/t fear of falling - Keith Morris refuses assistance to transition BUE from armrest to RW. Final stand completed x2 HHA achieves upright posture and tolerates ~2 min standing. MAX A don B socks seated in chair.  Keith Morris would benefit from skilled OT to address noted impairments and functional limitations (see below for any additional details) in order to maximize safety and independence while minimizing falls risk and caregiver burden. Upon hospital discharge, recommend STR to maximize Keith Morris safety and return to PLOF.       Recommendations for follow up therapy are one component of a multi-disciplinary discharge planning process, led by the attending physician.  Recommendations may be updated based on patient status, additional functional criteria and insurance authorization.   Follow Up Recommendations  Skilled nursing-short term rehab (<3 hours/day)    Assistance Recommended at  Discharge Intermittent Supervision/Assistance  Functional Status Assessment  Patient has had a recent decline in their functional status and demonstrates the ability to make significant improvements in function in a reasonable and predictable amount of time.  Equipment Recommendations  Other (comment) (defer to next venue of care)    Recommendations for Other Services       Precautions / Restrictions Precautions Precautions: Fall Restrictions Weight Bearing Restrictions: Yes RLE Weight Bearing: Weight bearing as tolerated      Mobility Bed Mobility               General bed mobility comments: received and left in chair    Transfers Overall transfer level: Needs assistance Equipment used: Rolling walker (2 wheels) Transfers: Sit to/from Stand Sit to Stand: Max assist;+2 physical assistance           General transfer comment: from low height      Balance Overall balance assessment: Needs assistance Sitting-balance support: No upper extremity supported;Feet supported Sitting balance-Leahy Scale: Good     Standing balance support: Bilateral upper extremity supported Standing balance-Leahy Scale: Poor                             ADL either performed or assessed with clinical judgement   ADL Overall ADL's : Needs assistance/impaired                                       General ADL Comments: SETUP self-feeding seated in chair. MAX  A x2 for ADL t/f. MAX A don B socks seated in chair      Pertinent Vitals/Pain Pain Assessment: 0-10     Hand Dominance Right   Extremity/Trunk Assessment Upper Extremity Assessment Upper Extremity Assessment: Generalized weakness (Arthritic changes to B hands)   Lower Extremity Assessment Lower Extremity Assessment: Generalized weakness       Communication Communication Communication: HOH   Cognition Arousal/Alertness: Awake/alert Behavior During Therapy: WFL for tasks  assessed/performed Overall Cognitive Status: Within Functional Limits for tasks assessed                                       General Comments       Exercises Exercises: Other exercises;General Lower Extremity;General Upper Extremity General Exercises - Upper Extremity Shoulder Flexion: AROM;Strengthening;Both;5 reps;Seated Shoulder Extension: AROM;Strengthening;Both;5 reps;Seated Elbow Flexion: AROM;Strengthening;Both;5 reps;Seated Elbow Extension: AROM;Strengthening;Both;5 reps;Seated General Exercises - Lower Extremity Ankle Circles/Pumps: AROM;Strengthening;Both;5 reps;Seated Gluteal Sets: AROM;Strengthening;Both;5 reps;Seated Other Exercises Other Exercises: Keith Morris educated re; OT role, DME recs, d/c recs, falls prevention, HEP Other Exercises: LBD, self-feeding, sit<>stand x3, sitting/standing balance/tolerance   Shoulder Instructions      Home Living Family/patient expects to be discharged to:: Private residence Living Arrangements: Alone Available Help at Discharge: Family Type of Home: House Home Access: Stairs to enter Secretary/administrator of Steps: 1   Home Layout: One level     Bathroom Shower/Tub: Chief Strategy Officer: Handicapped height     Home Equipment: Agricultural consultant (2 wheels);Rollator (4 wheels);Cane - single point   Additional Comments: uses sink for washing 2/2 falls risk      Prior Functioning/Environment Prior Level of Function : Needs assist;History of Falls (last six months)             Mobility Comments: household AMB at baseline with RW ADLs Comments: MOD I using RW, scared to use shower 2/2 falls risk        OT Problem List: Decreased strength;Decreased range of motion;Decreased activity tolerance;Impaired balance (sitting and/or standing);Decreased safety awareness      OT Treatment/Interventions: Self-care/ADL training;Therapeutic exercise;Energy conservation;DME and/or AE instruction;Therapeutic  activities;Balance training;Patient/family education    OT Goals(Current goals can be found in the care plan section) Acute Rehab OT Goals Patient Stated Goal: to stand OT Goal Formulation: With patient Time For Goal Achievement: 09/06/21 Potential to Achieve Goals: Good  OT Frequency: Min 2X/week   Barriers to D/C: Decreased caregiver support             AM-PAC OT "6 Clicks" Daily Activity     Outcome Measure Help from another person eating meals?: A Little Help from another person taking care of personal grooming?: A Little Help from another person toileting, which includes using toliet, bedpan, or urinal?: A Lot Help from another person bathing (including washing, rinsing, drying)?: A Lot Help from another person to put on and taking off regular upper body clothing?: A Little Help from another person to put on and taking off regular lower body clothing?: A Lot 6 Click Score: 15   End of Session Equipment Utilized During Treatment: Gait belt;Rolling walker (2 wheels) Nurse Communication: Mobility status  Activity Tolerance: Patient tolerated treatment well Patient left: in chair;with call bell/phone within reach;with nursing/sitter in room  OT Visit Diagnosis: Other abnormalities of gait and mobility (R26.89);Muscle weakness (generalized) (M62.81)  Time: 8527-7824 OT Time Calculation (min): 32 min Charges:  OT General Charges $OT Visit: 1 Visit OT Evaluation $OT Eval Low Complexity: 1 Low OT Treatments $Self Care/Home Management : 8-22 mins $Therapeutic Exercise: 8-22 mins  Kathie Dike, M.S. OTR/L  08/23/21, 12:36 PM  ascom [phone]

## 2021-08-23 NOTE — Progress Notes (Signed)
Patient has refused for foley catheter to be removed during shift stating that he wants to be able to have a bowel movement before it is removed. Patient has been given senna and colace per order today. Discussed with patient that catheter will need to be removed and that he can still have a bowel movement without catheter in place. Patient verbalized understanding. Will discuss with oncoming night shift nurse and patient.

## 2021-08-23 NOTE — TOC Initial Note (Signed)
Transition of Care [facility]) - Initial/Assessment Note    Patient Details  Name: Keith Morris MRN: [id number] Date of Birth: 09/06/1932  [facility]) CM/SW Contact:    Caryn Section, RN Phone Number: 08/23/2021, 1:57 PM  Clinical Narrative:    RNCM spoke to patient and grandson, who both accepted SNF recommendation.  Patient prefers [facility].  RNCM explained that we cannot guarantee placement, but will do a bed search in the [place] area.    Lucila Maine asks that we notified patient's son or daughter with bed offers. Bed search started, TOC contact information provided, TOC will follow.               Expected Discharge Plan: Skilled Nursing Facility Barriers to Discharge: Continued Medical Work up   Patient Goals and CMS Choice     Choice offered to / list presented to : Adult Children  Expected Discharge Plan and Services Expected Discharge Plan: Skilled Nursing Facility   Discharge Planning Services: CM Consult Post Acute Care Choice: Skilled Nursing Facility Living arrangements for the past 2 months: Single Family Home                                      Prior Living Arrangements/Services Living arrangements for the past 2 months: Single Family Home   Patient language and need for interpreter reviewed:: Yes (No interpreter required) Do you feel safe going back to the place where you live?: Yes (After rehab)      Need for Family Participation in Patient Care: Yes (Comment) Care giver support system in place?: Yes (comment)   Criminal Activity/Legal Involvement Pertinent to Current Situation/Hospitalization: No - Comment as needed  Activities of Daily Living Home Assistive Devices/Equipment: Walker (specify type) ADL Screening (condition at time of admission) Patient's cognitive ability adequate to safely complete daily activities?: Yes Is the patient deaf or have difficulty hearing?: No Does the patient have difficulty seeing, even when  wearing glasses/contacts?: No Does the patient have difficulty concentrating, remembering, or making decisions?: No Patient able to express need for assistance with ADLs?: Yes Does the patient have difficulty dressing or bathing?: No Independently performs ADLs?: Yes (appropriate for developmental age) Does the patient have difficulty walking or climbing stairs?: Yes (uses a walker) Weakness of Legs: Left Weakness of Arms/Hands: None  Permission Sought/Granted Permission sought to share information with : Case Manager Permission granted to share information with : Yes, Verbal Permission Granted     Permission granted to share info w AGENCY: Prospective SNF        Emotional Assessment Appearance:: Appears stated age Attitude/Demeanor/Rapport: Gracious, Engaged Affect (typically observed): Pleasant, Appropriate Orientation: : Oriented to Self, Oriented to Place, Oriented to Situation Alcohol / Substance Use: Not Applicable Psych Involvement: No (comment)  Admission diagnosis:  Surgery, elective [Z41.9] Fall [W19.XXXA] Closed femur fracture (HCC) [S72.90XA] Patient Active Problem List   Diagnosis Date Noted   Pressure injury of skin 08/22/2021   Closed femur fracture (HCC) 08/21/2021   Rhabdomyolysis 05/18/2020   Dehydration    Skin tear of right elbow without complication    Knee effusion, left    Fall 05/17/2020   AKI (acute kidney injury) (HCC) 05/17/2020   Elevated troponin 05/17/2020   Elevated CK 05/17/2020   Leukocytosis 05/17/2020   Tobacco abuse 05/17/2020   Syncope 05/17/2020   Abnormal EKG    PCP:  Pcp, No Pharmacy:  [id number] Nicholes Rough, [place] - [address] AT [facility] [address] [place] [address] Phone: [phone] Fax: [phone]     Social Determinants of Health (SDOH) Interventions    Readmission Risk Interventions No flowsheet data found.

## 2021-08-23 NOTE — Evaluation (Signed)
Physical Therapy Evaluation Patient Details Name: Keith Morris MRN: [id number] DOB: 07-22-1932 Today's Date: 08/23/2021  History of Present Illness  Keith Morris is an 89yoM who comes to [facility] on 11/16 after mechanical fall, imaging revealing of subsequent rt hip fracture. Pt returned to home with assistance and after 2 days of being unable to walk then came to the ED. PMH: tobacco use, 2 episodes of syncope in August 2021. pt went to OR on 11/17 c Dr. Scherry Ran for IM nail fixation Rt femur, then made WBAT.  Clinical Impression  Pt admitted with above diagnosis. Pt currently with functional limitations due to the deficits listed below (see "PT Problem List"). Upon entry, pt in bed, awake and agreeable to participate. The pt is alert, pleasant, interactive, and able to provide info regarding prior level of function, both in tolerance and independence. Pt is globally quite weak and limited despite adequate pain control this visit. His transition from supine sitting requires >4 minutes, would not allow him to exit the home in event of emergency. He is unable to stand without and elevated surface and maxA; he requires 2 people or a mechanical lift for transition from bed to chair. Pt is unable to walk. Pt is orthostatic, no symptoms, but does have decreased mental clarity with BP drop. Patient's performance this date reveals decreased ability, independence, and tolerance in performing all basic mobility required for performance of activities of daily living. Pt requires additional DME, close physical assistance, and cues for safe participate in mobility. Pt will benefit from skilled PT intervention to increase independence and safety with basic mobility in preparation for discharge to the venue listed below.    Orthostatic VS for the past 24 hrs (Last 3 readings):  BP- Lying Pulse- Lying BP- Sitting Pulse- Sitting BP- Standing at 0 minutes Pulse- Standing at 0 minutes  08/23/21 0711 137/53 75 126/43 80  119/50 82          Recommendations for follow up therapy are one component of a multi-disciplinary discharge planning process, led by the attending physician.  Recommendations may be updated based on patient status, additional functional criteria and insurance authorization.  Follow Up Recommendations Skilled nursing-short term rehab (<3 hours/day)    Assistance Recommended at Discharge Intermittent Supervision/Assistance  Functional Status Assessment Patient has had a recent decline in their functional status and demonstrates the ability to make significant improvements in function in a reasonable and predictable amount of time.  Equipment Recommendations  None recommended by PT    Recommendations for Other Services       Precautions / Restrictions Precautions Precautions: Fall Restrictions Weight Bearing Restrictions: Yes RLE Weight Bearing: Weight bearing as tolerated      Mobility  Bed Mobility Overal bed mobility: Needs Assistance Bed Mobility: Supine to Sit     Supine to sit: Min guard     General bed mobility comments: takes about 4 minutes, but is able to with great effort without assist at his preference. Struggles most with forward scoot.    Transfers Overall transfer level: Needs assistance Equipment used: Rolling walker (2 wheels) Transfers: Sit to/from Stand;Bed to chair/wheelchair/BSC Sit to Stand: Max assist Stand pivot transfers: Total assist         General transfer comment: unable to perform with RW; maxA to rise to standing from elevated surface;    Ambulation/Gait                  Stairs  Wheelchair Mobility    Modified Rankin (Stroke Patients Only)       Balance Overall balance assessment: History of Falls;Mild deficits observed, not formally tested                                           Pertinent Vitals/Pain      Home Living Family/patient expects to be discharged to:: Private  residence Living Arrangements: Alone Available Help at Discharge: Family Type of Home: House Home Access: Stairs to enter   Secretary/administrator of Steps: 1   Home Layout: One level Home Equipment: Agricultural consultant (2 wheels);Rollator (4 wheels);Cane - single point      Prior Function Prior Level of Function : Needs assist;History of Falls (last six months)             Mobility Comments: household AMB at baseline with RW ADLs Comments: modI     Hand Dominance        Extremity/Trunk Assessment                Communication      Cognition Arousal/Alertness: Awake/alert Behavior During Therapy: WFL for tasks assessed/performed Overall Cognitive Status: Within Functional Limits for tasks assessed                                          General Comments      Exercises General Exercises - Lower Extremity Long Arc Quad: AROM;15 reps;Right;Seated;Limitations Long Arc Quad Limitations: lacks full range   Assessment/Plan    PT Assessment Patient needs continued PT services  PT Problem List Decreased strength;Decreased range of motion;Decreased activity tolerance;Decreased balance;Decreased mobility;Decreased knowledge of use of DME;Decreased safety awareness;Decreased knowledge of precautions       PT Treatment Interventions DME instruction;Balance training;Gait training;Neuromuscular re-education;Stair training;Cognitive remediation;Functional mobility training;Patient/family education;Therapeutic activities;Therapeutic exercise    PT Goals (Current goals can be found in the Care Plan section)  Acute Rehab PT Goals Patient Stated Goal: return to bbaseline PT Goal Formulation: With patient Time For Goal Achievement: 09/06/21 Potential to Achieve Goals: Good    Frequency 7X/week   Barriers to discharge Inaccessible home environment;Decreased caregiver support      Co-evaluation               AM-PAC PT "6 Clicks" Mobility   Outcome Measure Help needed turning from your back to your side while in a flat bed without using bedrails?: A Little Help needed moving from lying on your back to sitting on the side of a flat bed without using bedrails?: A Little Help needed moving to and from a bed to a chair (including a wheelchair)?: Total Help needed standing up from a chair using your arms (e.g., wheelchair or bedside chair)?: Total Help needed to walk in hospital room?: Total Help needed climbing 3-5 steps with a railing? : Total 6 Click Score: 10    End of Session Equipment Utilized During Treatment: Gait belt Activity Tolerance: Patient tolerated treatment well;Treatment limited secondary to medical complications (Comment) Patient left: in chair;with call bell/phone within reach   PT Visit Diagnosis: Unsteadiness on feet (R26.81);Other abnormalities of gait and mobility (R26.89);Difficulty in walking, not elsewhere classified (R26.2);Muscle weakness (generalized) (M62.81);History of falling (Z91.81)    Time: 9024-0973 PT Time Calculation (min) (ACUTE ONLY): 40 min  Charges:   PT Evaluation $PT Eval Moderate Complexity: 1 Mod PT Treatments $Therapeutic Exercise: 8-22 mins $Therapeutic Activity: 8-22 mins      7:47 AM, 08/23/21 Rosamaria Lints, PT, DPT Physical Therapist - [facility]  [phone] (ASCOM)      Chareese Sergent C 08/23/2021, 7:44 AM

## 2021-08-23 NOTE — Progress Notes (Signed)
Subjective:  POD #1 s/p intramedullary fixation for right intertrochanteric hip fracture.   Patient reports right hip pain as mild.  Patient sitting up in bed and states he feels he needs to have a bowel movement.  He is requesting a suppository.  Objective:   VITALS:   Vitals:   08/22/21 1608 08/22/21 2004 08/22/21 2313 08/23/21 0348  BP: 135/69 (!) 133/59 (!) 115/56 135/90  Pulse: 78 74 74 80  Resp: 18 15 17 16   Temp: (!) 96.7 F (35.9 C) 97.9 F (36.6 C) 98 F (36.7 C) 98.2 F (36.8 C)  TempSrc: Axillary     SpO2: 100% 100% 98% 98%  Weight:      Height:        PHYSICAL EXAM: Right lower extremity Neurovascular intact Sensation intact distally Intact pulses distally Dorsiflexion/Plantar flexion intact Incision: dressing C/D/I No cellulitis present Compartment soft  LABS  Results for orders placed or performed during the hospital encounter of 08/21/21 (from the past 24 hour(s))  Type and screen [facility]     Status: None   Collection Time: 08/22/21 11:06 AM  Result Value Ref Range   ABO/RH(D) O POS    Antibody Screen NEG    Sample Expiration      08/25/2021,2359 Performed at [facility], [address] [place], [place]   Basic metabolic panel     Status: Abnormal   Collection Time: 08/23/21  5:06 AM  Result Value Ref Range   Sodium 135 135 - 145 mmol/L   Potassium 3.6 3.5 - 5.1 mmol/L   Chloride 108 98 - 111 mmol/L   CO2 17 (L) 22 - 32 mmol/L   Glucose, Bld 105 (H) 70 - 99 mg/dL   BUN 46 (H) 8 - 23 mg/dL   Creatinine, Ser 08/25/21 (H) 0.61 - 1.24 mg/dL   Calcium 7.6 (L) 8.9 - 10.3 mg/dL   GFR, Estimated 47 (L) >60 mL/min   Anion gap 10 5 - 15  CBC     Status: Abnormal   Collection Time: 08/23/21  5:06 AM  Result Value Ref Range   WBC 13.3 (H) 4.0 - 10.5 K/uL   RBC 2.51 (L) 4.22 - 5.81 MIL/uL   Hemoglobin 8.1 (L) 13.0 - 17.0 g/dL   HCT 08/25/21 (L) 17.5 - 10.2 %   MCV 95.6 80.0 - 100.0 fL   MCH 32.3 26.0 - 34.0  pg   MCHC 33.8 30.0 - 36.0 g/dL   RDW 58.5 27.7 - 82.4 %   Platelets 192 150 - 400 K/uL   nRBC 0.0 0.0 - 0.2 %    DG Chest 1 View  Result Date: 08/21/2021 CLINICAL DATA:  Status post fall with subsequent right hip pain. EXAM: CHEST  1 VIEW COMPARISON:  None. FINDINGS: The heart size and mediastinal contours are within normal limits. Both lungs are clear. Degenerative changes seen involving the bilateral shoulders and thoracic spine. IMPRESSION: No active disease. Electronically Signed   By: 08/23/2021 M.D.   On: 08/21/2021 17:54   DG Pelvis 1-2 Views  Result Date: 08/21/2021 CLINICAL DATA:  Right-sided hip and thigh pain following fall EXAM: PELVIS - 1 VIEW; RIGHT FEMUR 2 VIEWS COMPARISON:  None. FINDINGS: Intertrochanteric fracture of the right femur, with rotation and impaction. No other fracture is seen in the pelvis or femur. Severe degenerative changes at the knee. Degenerative changes are also noted in the bilateral acetabula, sacroiliac joints, and lower lumbar spine. IMPRESSION: Intertrochanteric fracture of  the right femur. Electronically Signed   By: Merilyn Baba M.D.   On: 08/21/2021 17:55   DG C-Arm 1-60 Min-No Report  Result Date: 08/22/2021 Fluoroscopy was utilized by the requesting physician.  No radiographic interpretation.   DG C-Arm 1-60 Min-No Report  Result Date: 08/22/2021 Fluoroscopy was utilized by the requesting physician.  No radiographic interpretation.   DG HIP UNILAT WITH PELVIS 2-3 VIEWS RIGHT  Result Date: 08/22/2021 CLINICAL DATA:  Right hip intramedullary nail EXAM: DG HIP (WITH OR WITHOUT PELVIS) 2-3V RIGHT COMPARISON:  Pelvis radiographs obtained 1 day prior FINDINGS: Four C-arm fluoroscopic images were obtained intraoperatively and submitted for post operative interpretation. Images demonstrate interval intramedullary nail and screw fixation of the femur 4 fixation of a proximal femoral fracture. Hardware alignment appears within expected  limits, without evidence of immediate complication. Degenerative changes are seen about the hip and knee. Please see the performing provider's procedural report for further detail. IMPRESSION: Interval intramedullary nail and screw fixation of the proximal right femoral fracture. Electronically Signed   By: Valetta Mole M.D.   On: 08/22/2021 14:14   DG FEMUR, MIN 2 VIEWS RIGHT  Result Date: 08/22/2021 CLINICAL DATA:  Status post intramedullary rod fixation of right femur. EXAM: RIGHT FEMUR 2 VIEWS COMPARISON:  August 21, 2021. FINDINGS: Status post surgical internal fixation of proximal right femoral intertrochanteric fracture as well as intramedullary rod fixation of the right femur. Good alignment of fracture components is noted. Postsurgical changes are seen in the surrounding soft tissues. IMPRESSION: Status post surgical internal fixation of proximal right femoral intertrochanteric fracture with intramedullary rod fixation of the right femur. Electronically Signed   By: Marijo Conception M.D.   On: 08/22/2021 15:24   DG Femur Min 2 Views Right  Result Date: 08/21/2021 CLINICAL DATA:  Right-sided hip and thigh pain following fall EXAM: PELVIS - 1 VIEW; RIGHT FEMUR 2 VIEWS COMPARISON:  None. FINDINGS: Intertrochanteric fracture of the right femur, with rotation and impaction. No other fracture is seen in the pelvis or femur. Severe degenerative changes at the knee. Degenerative changes are also noted in the bilateral acetabula, sacroiliac joints, and lower lumbar spine. IMPRESSION: Intertrochanteric fracture of the right femur. Electronically Signed   By: Merilyn Baba M.D.   On: 08/21/2021 17:55    Assessment/Plan: 1 Day Post-Op   Principal Problem:   Closed femur fracture ([place]) Active Problems:   Fall   AKI (acute kidney injury) ([place])   Tobacco abuse   Pressure injury of skin  Patient remained stable postop.  His hemoglobin and hematocrit are within acceptable limits.  Patient will begin  physical therapy today.  His Foley catheter will be removed this morning.  Patient will be ordered for suppository.  Patient will need a skilled nursing facility upon discharge.  Encourage incentive spirometry while awake.    Thornton Park , MD 08/23/2021, 6:10 AM

## 2021-08-23 NOTE — NC FL2 (Signed)
[place] MEDICAID FL2 LEVEL OF CARE SCREENING TOOL     IDENTIFICATION  Patient Name: Keith Morris Birthdate: Jan 17, 1932 Sex: male Admission Date (Current Location): 08/21/2021  [facility] and [place] Number:  Chiropodist and Address:  [facility], [address], [place], [place] [address]      Provider Number: [id number]  Attending Physician Name and Address:  Tresa Moore, MD  Relative Name and Phone Number:  Joshuan, Bolander (Daughter)   [phone] [facility])    Current Level of Care: Hospital Recommended Level of Care: Skilled Nursing Facility Prior Approval Number:    Date Approved/Denied:   PASRR Number: [id number]  Discharge Plan: SNF    Current Diagnoses: Patient Active Problem List   Diagnosis Date Noted   Pressure injury of skin 08/22/2021   Closed femur fracture (HCC) 08/21/2021   Rhabdomyolysis 05/18/2020   Dehydration    Skin tear of right elbow without complication    Knee effusion, left    Fall 05/17/2020   AKI (acute kidney injury) (HCC) 05/17/2020   Elevated troponin 05/17/2020   Elevated CK 05/17/2020   Leukocytosis 05/17/2020   Tobacco abuse 05/17/2020   Syncope 05/17/2020   Abnormal EKG     Orientation RESPIRATION BLADDER Height & Weight     Self, Time, Situation, Place  Normal External catheter Weight: 68 kg Height:  5\' 11"  (180.3 cm)  BEHAVIORAL SYMPTOMS/MOOD NEUROLOGICAL BOWEL NUTRITION STATUS      Continent Diet (Regular diet)  AMBULATORY STATUS COMMUNICATION OF NEEDS Skin    (+2 transfer, did not ambulate yet this hospitalization) Verbally Skin abrasions, Bruising, PU Stage and Appropriate Care, Surgical wounds (abrasions and ecchymosis to bilateral arms, surgical incision to R hip with opsite)   PU Stage 2 Dressing:  (Honeycomb to sacrum)                   Personal Care Assistance Level of Assistance  Bathing, Feeding, Dressing Bathing Assistance: Maximum assistance Feeding  assistance: Limited assistance (needs set up) Dressing Assistance: Maximum assistance     Functional Limitations Info  Sight, Hearing, Speech Sight Info: Adequate Hearing Info: Adequate Speech Info: Adequate    SPECIAL CARE FACTORS FREQUENCY  PT (By licensed PT), OT (By licensed OT)     PT Frequency: Min 5x weekly OT Frequency: Min 5x weekly            Contractures Contractures Info: Not present    Additional Factors Info  Code Status, Allergies Code Status Info: FULL CODE             Current Medications (08/23/2021):  This is the current hospital active medication list Current Facility-Administered Medications  Medication Dose Route Frequency Provider Last Rate Last Admin   acetaminophen (TYLENOL) tablet 1,000 mg  1,000 mg Oral Q6H PRN 08/25/2021, MD       acetaminophen (TYLENOL) tablet 500 mg  500 mg Oral Q6H Juanell Fairly, MD   500 mg at 08/23/21 1334   alum & mag hydroxide-simeth (MAALOX/MYLANTA) 200-200-20 MG/5ML suspension 30 mL  30 mL Oral Q4H PRN 04-14-2001, MD       aspirin chewable tablet 81 mg  81 mg Oral Daily Juanell Fairly, MD   81 mg at 08/23/21 0917   bisacodyl (DULCOLAX) EC tablet 5 mg  5 mg Oral Daily PRN 08/25/21, MD   5 mg at 08/23/21 0617   Chlorhexidine Gluconate Cloth 2 % PADS 6 each  6 each Topical Daily 08/25/21,  MD   6 each at 08/23/21 0917   docusate sodium (COLACE) capsule 100 mg  100 mg Oral BID Juanell Fairly, MD   100 mg at 08/23/21 0916   enoxaparin (LOVENOX) injection 40 mg  40 mg Subcutaneous Q24H Juanell Fairly, MD   40 mg at 08/23/21 0920   feeding supplement (ENSURE ENLIVE / ENSURE PLUS) liquid 237 mL  237 mL Oral BID BM Juanell Fairly, MD   237 mL at 08/23/21 0924   HYDROcodone-acetaminophen (NORCO/VICODIN) 5-325 MG per tablet 1-2 tablet  1-2 tablet Oral Q6H PRN Juanell Fairly, MD   1 tablet at 08/22/21 2126   methocarbamol (ROBAXIN) tablet 500 mg  500 mg Oral Q6H PRN Juanell Fairly, MD        Or   methocarbamol (ROBAXIN) 500 mg in dextrose 5 % 50 mL IVPB  500 mg Intravenous Q6H PRN Juanell Fairly, MD       morphine 2 MG/ML injection 0.5-1 mg  0.5-1 mg Intravenous Q2H PRN Juanell Fairly, MD       multivitamin with minerals tablet 1 tablet  1 tablet Oral Daily Juanell Fairly, MD   1 tablet at 08/23/21 0917   ondansetron (ZOFRAN) tablet 4 mg  4 mg Oral Q6H PRN Juanell Fairly, MD       Or   ondansetron [facility]) injection 4 mg  4 mg Intravenous Q6H PRN Juanell Fairly, MD       polyethylene glycol (MIRALAX / GLYCOLAX) packet 17 g  17 g Oral Daily PRN Juanell Fairly, MD       senna Mancel Parsons) tablet 8.6 mg  1 tablet Oral BID Juanell Fairly, MD   8.6 mg at 08/23/21 0916   traMADol (ULTRAM) tablet 50 mg  50 mg Oral Q6H Juanell Fairly, MD   50 mg at 08/23/21 1334     Discharge Medications: Please see discharge summary for a list of discharge medications.  Relevant Imaging Results:  Relevant Lab Results:   Additional Information SS [id number]  Caryn Section, RN

## 2021-08-23 NOTE — Progress Notes (Signed)
Nutrition Follow-up  DOCUMENTATION CODES:   Non-severe (moderate) malnutrition in context of social or environmental circumstances  INTERVENTION:   -Continue Ensure Enlive po BID, each supplement provides 350 kcal and 20 grams of protein  -Continue MVI with minerals daily  NUTRITION DIAGNOSIS:   Moderate Malnutrition related to social / environmental circumstances as evidenced by energy intake < or equal to 75% for > or equal to 1 month, mild fat depletion, moderate fat depletion, mild muscle depletion, moderate muscle depletion.  Ongoing  GOAL:   Patient will meet greater than or equal to 90% of their needs  Progressing   MONITOR:   PO intake, Supplement acceptance, Labs, Weight trends, Skin, I & O's  REASON FOR ASSESSMENT:   Consult Assessment of nutrition requirement/status, Hip fracture protocol  ASSESSMENT:   Keith Morris is a 87 y.o. male with PMH significant of tobacco abuse presented to the ED s/p mechanical fall in the parking lot outside the barbershop 2 days ago.  He denies any head trauma or head injury.  He denies any loss of consciousness, nausea or vomiting.  Patient has developed right thigh pain which was manageable initially at assisted home. Patient has been remained in bed for last 2 days,  unable to walk due to the pain in the right hip.  He has not been able to walk to eat or drink.  Patient was brought in the ED by his grandson.  Patient has multiple bruises noted on his arms, grandson reported he has tendency to hit his hands on the [place].  11/17- s/p PROCEDURE:  INTRAMEDULLARY (IM) NAIL FIXATION FOR RIGHT INTERTROCHANTERIC  Reviewed I/O's: +5 ml x 24 hours  UOP: 1.2 L x 24 hours  Spoke with pt at bedside, who was pleasant and in good spirits today. He is happy to have gotten out of the bed. He reports good appetite, consuming all of his breakfast. He has multiple missing teeth, but denies difficulty chewing and swallowing foods. Noted meal  completions 100%  PTA pt was consuming 2 meals per day (Breakfast: sausage biscuit and Diner: meat, starch and vegetable from meals on wheels).   Reviewed wt hx; pt has experienced a 7.1% wt loss over the past 15 months, which is not significant for time frame. Pt unsure of UBW but denies weight loss.   Discussed importance of good meal and supplement intake to promote healing.   Medications reviewed and include colace and senokot.  Labs reviewed.   NUTRITION - FOCUSED PHYSICAL EXAM:  Flowsheet Row Most Recent Value  Orbital Region Mild depletion  Upper Arm Region Moderate depletion  Thoracic and Lumbar Region No depletion  Buccal Region Mild depletion  Temple Region Moderate depletion  Clavicle Bone Region Mild depletion  Clavicle and Acromion Bone Region Mild depletion  Scapular Bone Region Mild depletion  Dorsal Hand Moderate depletion  Patellar Region Moderate depletion  Anterior Thigh Region Moderate depletion  Posterior Calf Region Moderate depletion  Edema (RD Assessment) None  Hair Reviewed  Eyes Reviewed  Mouth Reviewed  Skin Reviewed  Nails Reviewed       Diet Order:   Diet Order             Diet regular Room service appropriate? Yes; Fluid consistency: Thin  Diet effective now                   EDUCATION NEEDS:   Education needs have been addressed  Skin:  Skin Assessment: Skin Integrity Issues: Skin Integrity  Issues:: Stage II, Incisions Stage II: sacrum Incisions: rt hip  Last BM:  08/16/21  Height:   Ht Readings from Last 1 Encounters:  08/22/21 5\' 11"  (1.803 m)    Weight:   Wt Readings from Last 1 Encounters:  08/22/21 68 kg    Ideal Body Weight:  78.2 kg  BMI:  Body mass index is 20.92 kg/m.  Estimated Nutritional Needs:   Kcal:  1850-2050  Protein:  90-105 grams  Fluid:  > 1.8 L    08/24/21, RD, LDN, CDCES Registered Dietitian II Certified Diabetes Care and Education Specialist Please refer to [facility] for RD  and/or RD on-call/weekend/after hours pager

## 2021-08-23 NOTE — TOC Progression Note (Signed)
Transition of Care [facility]) - Progression Note    Patient Details  Name: Keith Morris MRN: [id number] Date of Birth: 07/06/1932  Transition of Care [facility]) CM/SW Contact  Caryn Section, RN Phone Number: 08/23/2021, 3:40 PM  Clinical Narrative:   [facility] accepted, authorization started, patient and family made aware.TOC to follow    Expected Discharge Plan: Skilled Nursing Facility Barriers to Discharge: Continued Medical Work up  Expected Discharge Plan and Services Expected Discharge Plan: Skilled Nursing Facility   Discharge Planning Services: CM Consult Post Acute Care Choice: Skilled Nursing Facility Living arrangements for the past 2 months: Single Family Home                                       Social Determinants of Health (SDOH) Interventions    Readmission Risk Interventions No flowsheet data found.

## 2021-08-23 NOTE — Anesthesia Postprocedure Evaluation (Signed)
Anesthesia Post Note  Patient: Keith Morris  Procedure(s) Performed: INTRAMEDULLARY (IM) NAIL INTERTROCHANTRIC (Right: Hip)  Patient location during evaluation: PACU Anesthesia Type: Spinal Level of consciousness: oriented and awake and alert Pain management: pain level controlled Vital Signs Assessment: post-procedure vital signs reviewed and stable Respiratory status: spontaneous breathing, respiratory function stable and patient connected to nasal cannula oxygen Cardiovascular status: blood pressure returned to baseline and stable Postop Assessment: no headache, no backache and no apparent nausea or vomiting Anesthetic complications: no   No notable events documented.   Last Vitals:  Vitals:   08/22/21 2313 08/23/21 0348  BP: (!) 115/56 135/90  Pulse: 74 80  Resp: 17 16  Temp: 36.7 C 36.8 C  SpO2: 98% 98%    Last Pain:  Vitals:   08/23/21 0609  TempSrc:   PainSc: 0-No pain                 Johny Blamer

## 2021-08-23 NOTE — Progress Notes (Addendum)
PROGRESS NOTE    Keith Morris  LYY:[id number] DOB: August 10, 1932 DOA: 08/21/2021 PCP: Pcp, No    Brief Narrative:   87 y.o. male with PMH significant of tobacco abuse presented to the ED s/p mechanical fall in the parking lot outside the barbershop 2 days ago.  He denies any head trauma or head injury.  He denies any loss of consciousness, nausea or vomiting.  Patient has developed right thigh pain which was manageable initially at assisted home. Patient has been remained in bed for last 2 days,  unable to walk due to the pain in the right hip.  He has not been able to walk to eat or drink.  Patient was brought in the ED by his grandson.  Seen in consultation by orthopedics in the emergency department.  Discussed nature of fracture and recommended intramedullary fixation.  Patient successfully underwent intramedullary nail fixation for right intertrochanteric hip fracture on 11/17.  Tolerated procedure well with no immediate postoperative complications.  Assessment & Plan:   Principal Problem:   Closed femur fracture [facility]) Active Problems:   Fall   AKI (acute kidney injury) (HCC)   Tobacco abuse   Pressure injury of skin  Right intertrochanteric femur fracture s/p mechanical fall Patient reported fall 2 days prior to presentation in the parking lot. Pain was initially manageable but has gotten worse. X-ray shows intertrochanteric fracture of right femur. Orthopedics consulted in ED Status post IM fixation on 11/17 Doing well postoperatively Plan: Therapy evaluations SQ Lovenox Multimodal pain control TOC consult for facility placement  Stage 2 sacral pressure injury, present on admission  WOC consulted   Mild rhabdomyolysis, resolved CK4 93, consistent with right thigh pain Plan: No further IV fluids Encourage p.o. fluid intake   AKI, improved Increase in serum creatinine to ?1.5 times baseline, which is known or presumed to    have occurred within the prior seven  days Suspect prerenal azotemia Plan: DC IVF Avoid nephrotoxins Monitor kidney function    Leukocytosis Suspect reactive leukocytosis No clinical indication of infection   Tobacco abuse Counseling completed.   DVT prophylaxis: SQ Lovenox Code Status: Full Family Communication: Left VM for daughter Cala Bradford [phone] on 11/18 Disposition Plan: Status is: Inpatient  Remains inpatient appropriate because: Hip fracture postoperative day #1.  Therapy evaluations, pain control, monitor for BM.  Disposition to skilled nursing facility  Level of care: Med-Surg  Consultants:  Orthopedic surgery  Procedures:  IM nailing 11/17  Antimicrobials: None   Subjective: Patient seen and examined.  Sitting up in chair using incentive spirometer.  Pain well controlled  Objective: Vitals:   08/22/21 2004 08/22/21 2313 08/23/21 0348 08/23/21 0820  BP: (!) 133/59 (!) 115/56 135/90 106/61  Pulse: 74 74 80 70  Resp: 15 17 16 16   Temp: 97.9 F (36.6 C) 98 F (36.7 C) 98.2 F (36.8 C) (!) 97.5 F (36.4 C)  TempSrc:    Oral  SpO2: 100% 98% 98% 98%  Weight:      Height:        Intake/Output Summary (Last 24 hours) at 08/23/2021 0945 Last data filed at 08/23/2021 0348 Gross per 24 hour  Intake 1200 ml  Output 1195 ml  Net 5 ml   Filed Weights   08/21/21 1627 08/22/21 1036  Weight: 50 kg 68 kg    Examination:  General exam: Appears calm and comfortable  Respiratory system: Clear to auscultation. Respiratory effort normal. Cardiovascular system: S1-S2, RRR, no murmurs, no pedal edema Gastrointestinal system: Abdomen  is nondistended, soft and nontender. No organomegaly or masses felt. Normal bowel sounds heard. Central nervous system: Alert and oriented. No focal neurological deficits. Extremities: Right lower extremity decreased ROM, tender to touch, surgical site CDI Skin: Scattered ecchymoses both upper arms with skin tearing Psychiatry: Judgement and insight appear  normal. Mood & affect appropriate.     Data Reviewed: I have personally reviewed following labs and imaging studies  CBC: Recent Labs  Lab 08/21/21 1716 08/22/21 0547 08/23/21 0506  WBC 14.3* 11.2* 13.3*  NEUTROABS 11.8*  --   --   HGB 10.6* 8.7* 8.1*  HCT 31.7* 26.2* 24.0*  MCV 97.8 96.7 95.6  PLT 206 176 192   Basic Metabolic Panel: Recent Labs  Lab 08/21/21 1716 08/22/21 0547 08/23/21 0506  NA 138 138 135  K 3.8 3.8 3.6  CL 108 110 108  CO2 22 21* 17*  GLUCOSE 118* 106* 105*  BUN 34* 39* 46*  CREATININE 1.60* 1.51* 1.44*  CALCIUM 8.6* 7.9* 7.6*  MG  --  2.2  --   PHOS  --  3.6  --    GFR: Estimated Creatinine Clearance: 34.8 mL/min (A) (by C-G formula based on SCr of 1.44 mg/dL (H)). Liver Function Tests: Recent Labs  Lab 08/21/21 1716  AST 25  ALT 10  ALKPHOS 84  BILITOT 1.3*  PROT 7.6  ALBUMIN 3.9   No results for input(s): LIPASE, AMYLASE in the last 168 hours. No results for input(s): AMMONIA in the last 168 hours. Coagulation Profile: No results for input(s): INR, PROTIME in the last 168 hours. Cardiac Enzymes: Recent Labs  Lab 08/21/21 1716 08/22/21 0547  CKTOTAL 493* 352   BNP (last 3 results) No results for input(s): PROBNP in the last 8760 hours. HbA1C: No results for input(s): HGBA1C in the last 72 hours. CBG: No results for input(s): GLUCAP in the last 168 hours. Lipid Profile: No results for input(s): CHOL, HDL, LDLCALC, TRIG, CHOLHDL, LDLDIRECT in the last 72 hours. Thyroid Function Tests: No results for input(s): TSH, T4TOTAL, FREET4, T3FREE, THYROIDAB in the last 72 hours. Anemia Panel: No results for input(s): VITAMINB12, FOLATE, FERRITIN, TIBC, IRON, RETICCTPCT in the last 72 hours. Sepsis Labs: No results for input(s): PROCALCITON, LATICACIDVEN in the last 168 hours.  Recent Results (from the past 240 hour(s))  Resp Panel by RT-PCR (Flu A&B, Covid) Nasopharyngeal Swab     Status: None   Collection Time: 08/21/21  5:16  PM   Specimen: Nasopharyngeal Swab; Nasopharyngeal(NP) swabs in vial transport medium  Result Value Ref Range Status   SARS Coronavirus 2 by RT PCR NEGATIVE NEGATIVE Final    Comment: (NOTE) SARS-CoV-2 target nucleic acids are NOT DETECTED.  The SARS-CoV-2 RNA is generally detectable in upper respiratory specimens during the acute phase of infection. The lowest concentration of SARS-CoV-2 viral copies this assay can detect is 138 copies/mL. A negative result does not preclude SARS-Cov-2 infection and should not be used as the sole basis for treatment or other patient management decisions. A negative result may occur with  improper specimen collection/handling, submission of specimen other than nasopharyngeal swab, presence of viral mutation(s) within the areas targeted by this assay, and inadequate number of viral copies(<138 copies/mL). A negative result must be combined with clinical observations, patient history, and epidemiological information. The expected result is Negative.  Fact Sheet for Patients:  BloggerCourse.com  Fact Sheet for Healthcare Providers:  SeriousBroker.it  This test is no t yet approved or cleared by the [place] FDA  and  has been authorized for detection and/or diagnosis of SARS-CoV-2 by FDA under an Emergency Use Authorization (EUA). This EUA will remain  in effect (meaning this test can be used) for the duration of the COVID-19 declaration under Section 564(b)(1) of the Act, 21 U.S.C.section 360bbb-3(b)(1), unless the authorization is terminated  or revoked sooner.       Influenza A by PCR NEGATIVE NEGATIVE Final   Influenza B by PCR NEGATIVE NEGATIVE Final    Comment: (NOTE) The Xpert Xpress SARS-CoV-2/FLU/RSV plus assay is intended as an aid in the diagnosis of influenza from Nasopharyngeal swab specimens and should not be used as a sole basis for treatment. Nasal washings and aspirates are  unacceptable for Xpert Xpress SARS-CoV-2/FLU/RSV testing.  Fact Sheet for Patients: BloggerCourse.com  Fact Sheet for Healthcare Providers: SeriousBroker.it  This test is not yet approved or cleared by the [place] FDA and has been authorized for detection and/or diagnosis of SARS-CoV-2 by FDA under an Emergency Use Authorization (EUA). This EUA will remain in effect (meaning this test can be used) for the duration of the COVID-19 declaration under Section 564(b)(1) of the Act, 21 U.S.C. section 360bbb-3(b)(1), unless the authorization is terminated or revoked.  Performed at [facility], [address], [place], [place] [address]          Radiology Studies: DG Chest 1 View  Result Date: 08/21/2021 CLINICAL DATA:  Status post fall with subsequent right hip pain. EXAM: CHEST  1 VIEW COMPARISON:  None. FINDINGS: The heart size and mediastinal contours are within normal limits. Both lungs are clear. Degenerative changes seen involving the bilateral shoulders and thoracic spine. IMPRESSION: No active disease. Electronically Signed   By: Aram Candela M.D.   On: 08/21/2021 17:54   DG Pelvis 1-2 Views  Result Date: 08/21/2021 CLINICAL DATA:  Right-sided hip and thigh pain following fall EXAM: PELVIS - 1 VIEW; RIGHT FEMUR 2 VIEWS COMPARISON:  None. FINDINGS: Intertrochanteric fracture of the right femur, with rotation and impaction. No other fracture is seen in the pelvis or femur. Severe degenerative changes at the knee. Degenerative changes are also noted in the bilateral acetabula, sacroiliac joints, and lower lumbar spine. IMPRESSION: Intertrochanteric fracture of the right femur. Electronically Signed   By: Wiliam Ke M.D.   On: 08/21/2021 17:55   DG C-Arm 1-60 Min-No Report  Result Date: 08/22/2021 Fluoroscopy was utilized by the requesting physician.  No radiographic interpretation.   DG C-Arm 1-60 Min-No  Report  Result Date: 08/22/2021 Fluoroscopy was utilized by the requesting physician.  No radiographic interpretation.   DG HIP UNILAT WITH PELVIS 2-3 VIEWS RIGHT  Result Date: 08/22/2021 CLINICAL DATA:  Right hip intramedullary nail EXAM: DG HIP (WITH OR WITHOUT PELVIS) 2-3V RIGHT COMPARISON:  Pelvis radiographs obtained 1 day prior FINDINGS: Four C-arm fluoroscopic images were obtained intraoperatively and submitted for post operative interpretation. Images demonstrate interval intramedullary nail and screw fixation of the femur 4 fixation of a proximal femoral fracture. Hardware alignment appears within expected limits, without evidence of immediate complication. Degenerative changes are seen about the hip and knee. Please see the performing provider's procedural report for further detail. IMPRESSION: Interval intramedullary nail and screw fixation of the proximal right femoral fracture. Electronically Signed   By: Lesia Hausen M.D.   On: 08/22/2021 14:14   DG FEMUR, MIN 2 VIEWS RIGHT  Result Date: 08/22/2021 CLINICAL DATA:  Status post intramedullary rod fixation of right femur. EXAM: RIGHT FEMUR 2 VIEWS COMPARISON:  August 21, 2021. FINDINGS: Status post surgical internal fixation of proximal right femoral intertrochanteric fracture as well as intramedullary rod fixation of the right femur. Good alignment of fracture components is noted. Postsurgical changes are seen in the surrounding soft tissues. IMPRESSION: Status post surgical internal fixation of proximal right femoral intertrochanteric fracture with intramedullary rod fixation of the right femur. Electronically Signed   By: Lupita Raider M.D.   On: 08/22/2021 15:24   DG Femur Min 2 Views Right  Result Date: 08/21/2021 CLINICAL DATA:  Right-sided hip and thigh pain following fall EXAM: PELVIS - 1 VIEW; RIGHT FEMUR 2 VIEWS COMPARISON:  None. FINDINGS: Intertrochanteric fracture of the right femur, with rotation and impaction. No other  fracture is seen in the pelvis or femur. Severe degenerative changes at the knee. Degenerative changes are also noted in the bilateral acetabula, sacroiliac joints, and lower lumbar spine. IMPRESSION: Intertrochanteric fracture of the right femur. Electronically Signed   By: Wiliam Ke M.D.   On: 08/21/2021 17:55        Scheduled Meds:  acetaminophen  500 mg Oral Q6H   aspirin  81 mg Oral Daily   Chlorhexidine Gluconate Cloth  6 each Topical Daily   docusate sodium  100 mg Oral BID   enoxaparin (LOVENOX) injection  40 mg Subcutaneous Q24H   feeding supplement  237 mL Oral BID BM   multivitamin with minerals  1 tablet Oral Daily   senna  1 tablet Oral BID   traMADol  50 mg Oral Q6H   Continuous Infusions:  methocarbamol (ROBAXIN) IV       LOS: 2 days    Time spent: 25 minutes    Tresa Moore, MD [facility]   If 7PM-7AM, please contact night-coverage  08/23/2021, 9:45 AM

## 2021-08-24 DIAGNOSIS — E440 Moderate protein-calorie malnutrition: Secondary | ICD-10-CM | POA: Insufficient documentation

## 2021-08-24 LAB — CBC
HCT: 22.6 % — ABNORMAL LOW (ref 39.0–52.0)
Hemoglobin: 7.3 g/dL — ABNORMAL LOW (ref 13.0–17.0)
MCH: 31.3 pg (ref 26.0–34.0)
MCHC: 32.3 g/dL (ref 30.0–36.0)
MCV: 97 fL (ref 80.0–100.0)
Platelets: 162 10*3/uL (ref 150–400)
RBC: 2.33 MIL/uL — ABNORMAL LOW (ref 4.22–5.81)
RDW: 13.8 % (ref 11.5–15.5)
WBC: 8.8 10*3/uL (ref 4.0–10.5)
nRBC: 0 % (ref 0.0–0.2)

## 2021-08-24 LAB — BASIC METABOLIC PANEL
Anion gap: 5 (ref 5–15)
BUN: 44 mg/dL — ABNORMAL HIGH (ref 8–23)
CO2: 21 mmol/L — ABNORMAL LOW (ref 22–32)
Calcium: 7.9 mg/dL — ABNORMAL LOW (ref 8.9–10.3)
Chloride: 108 mmol/L (ref 98–111)
Creatinine, Ser: 1.22 mg/dL (ref 0.61–1.24)
GFR, Estimated: 57 mL/min — ABNORMAL LOW (ref >60)
Glucose, Bld: 112 mg/dL — ABNORMAL HIGH (ref 70–99)
Potassium: 3.6 mmol/L (ref 3.5–5.1)
Sodium: 134 mmol/L — ABNORMAL LOW (ref 135–145)

## 2021-08-24 LAB — HEMOGLOBIN AND HEMATOCRIT, BLOOD
HCT: 28.1 % — ABNORMAL LOW (ref 39.0–52.0)
Hemoglobin: 9.4 g/dL — ABNORMAL LOW (ref 13.0–17.0)

## 2021-08-24 LAB — PREPARE RBC (CROSSMATCH)

## 2021-08-24 LAB — ABO/RH: ABO/RH(D): O POS

## 2021-08-24 MED ORDER — SODIUM CHLORIDE 0.9% IV SOLUTION: Freq: Once | INTRAVENOUS | Status: AC

## 2021-08-24 MED ORDER — POLYETHYLENE GLYCOL 3350 17 G PO PACK
17.0000 g | PACK | Freq: Every day | ORAL | Status: DC
Start: 2021-08-24 — End: 2021-08-27
  Administered 2021-08-24 – 2021-08-27 (×4): 17 g via ORAL
  Filled 2021-08-24 (×4): qty 1

## 2021-08-24 MED ORDER — SENNOSIDES-DOCUSATE SODIUM 8.6-50 MG PO TABS
1.0000 | ORAL_TABLET | Freq: Two times a day (BID) | ORAL | Status: DC
  Administered 2021-08-24 – 2021-08-27 (×7): 1 via ORAL
  Filled 2021-08-24 (×7): qty 1

## 2021-08-24 MED ORDER — BISACODYL 10 MG RE SUPP
10.0000 mg | Freq: Every day | RECTAL | Status: DC | PRN
  Administered 2021-08-24: 10 mg via RECTAL
  Filled 2021-08-24: qty 1

## 2021-08-24 NOTE — Progress Notes (Signed)
Physical Therapy Treatment Patient Details Name: Keith Morris MRN: [id number] DOB: 1932-04-24 Today's Date: 08/24/2021   History of Present Illness Keith Morris is an 89yoM who comes to [facility] on 11/16 after mechanical fall, imaging revealing of subsequent rt hip fracture. Pt returned to home with assistance and after 2 days of being unable to walk then came to the ED. PMH: tobacco use, 2 episodes of syncope in August 2021. pt went to OR on 11/17 c Dr. Scherry Ran for IM nail fixation Rt femur, then made WBAT.    PT Comments    Pt was long sitting in bed upon arriving. He received transfusion earlier and is agreeable to session. Eager to get OOB to eat lunch. Tray placed out of reach of pt. He reports no pain at rest however quickly elevated to 8/10 pain with wt bearing activity. He required more assistance to exit bed today with increased time and vcs throughout. Stood 2 x from elevated bed height prior to stand pivot to recliner. Heavy max assist for all standing and stand pivot. Will require +2 assistance to return to bed or use of hoyer lift for safety.Author highly recommends DC to SNF to address deficits while maximizing independence with ADLs.    Recommendations for follow up therapy are one component of a multi-disciplinary discharge planning process, led by the attending physician.  Recommendations may be updated based on patient status, additional functional criteria and insurance authorization.  Follow Up Recommendations  Skilled nursing-short term rehab (<3 hours/day)     Assistance Recommended at Discharge Frequent or constant Supervision/Assistance  Equipment Recommendations   (defer to next level of care)       Precautions / Restrictions Precautions Precautions: Fall Restrictions Weight Bearing Restrictions: Yes RLE Weight Bearing: Weight bearing as tolerated     Mobility  Bed Mobility Overal bed mobility: Needs Assistance Bed Mobility: Supine to Sit     Supine to  sit: Mod assist;Max assist     General bed mobility comments: pt required more asisstance to exit bed today. Increased time + lots of Vcs.    Transfers Overall transfer level: Needs assistance Equipment used: Rolling walker (2 wheels) Transfers: Sit to/from Stand Sit to Stand: Max assist;From elevated surface Stand pivot transfers: Max assist;From elevated surface         General transfer comment: Pt was able to stand 2 x EOB prior to stand pivot to recliner. Heavy max assist to stand pivot. will require +2 assistance to return to bed. May be safier to use hoyer.    Ambulation/Gait      General Gait Details: unable/ unsafe to attempt      Balance Overall balance assessment: Needs assistance Sitting-balance support: No upper extremity supported;Feet supported Sitting balance-Leahy Scale: Good     Standing balance support: Bilateral upper extremity supported Standing balance-Leahy Scale: Poor Standing balance comment: needs constant assistance even with BUE support on RW.       Cognition Arousal/Alertness: Awake/alert Behavior During Therapy: WFL for tasks assessed/performed Overall Cognitive Status: No family/caregiver present to determine baseline cognitive functioning      General Comments: Pt is A and pleasant. Oriented x 3 however has some cognition deficits come to light during session. lives alone with no family present to determine baseline cognition               Pertinent Vitals/Pain Pain Assessment: 0-10 Pain Score: 8  Pain Location: BLEs in wt bearing Pain Descriptors / Indicators: Aching;Burning;Constant;Discomfort Pain Intervention(s): Limited activity  within patient's tolerance;Monitored during session;Premedicated before session;Repositioned     PT Goals (current goals can now be found in the care plan section) Acute Rehab PT Goals Patient Stated Goal: return home Progress towards PT goals: Not progressing toward goals - comment (more  assistance required for mobility today)    Frequency    7X/week      PT Plan Current plan remains appropriate       AM-PAC PT "6 Clicks" Mobility   Outcome Measure  Help needed turning from your back to your side while in a flat bed without using bedrails?: A Little Help needed moving from lying on your back to sitting on the side of a flat bed without using bedrails?: A Lot Help needed moving to and from a bed to a chair (including a wheelchair)?: A Lot Help needed standing up from a chair using your arms (e.g., wheelchair or bedside chair)?: A Lot Help needed to walk in hospital room?: Total Help needed climbing 3-5 steps with a railing? : Total 6 Click Score: 11    End of Session Equipment Utilized During Treatment: Gait belt Activity Tolerance: Patient tolerated treatment well Patient left: in chair;with call bell/phone within reach;with chair alarm set Nurse Communication: Mobility status PT Visit Diagnosis: Unsteadiness on feet (R26.81);Other abnormalities of gait and mobility (R26.89);Difficulty in walking, not elsewhere classified (R26.2);Muscle weakness (generalized) (M62.81);History of falling (Z91.81)     Time: 8099-8338 PT Time Calculation (min) (ACUTE ONLY): 23 min  Charges:  $Therapeutic Activity: 23-37 mins                    Jetta Lout PTA 08/24/21, 2:34 PM

## 2021-08-24 NOTE — TOC Progression Note (Addendum)
Transition of Care [facility]) - Progression Note    Patient Details  Name: TAB RYLEE MRN: [id number] Date of Birth: [birth year]  [facility] CM/SW Contact  Liliana Cline, LCSW Phone Number: 08/24/2021, 8:48 AM  Clinical Narrative:    Patient has insurance auth for [facility]. Auth ID [id number]. CSW reached out to [place] [facility] and inquired when they will have a bed, waiting on response.   11:55- Call to [facility] again. Spoke to [employer] who said they will try to see if they can figure out someone to call me back about bed availability. Harvie Heck does not think they are taking patients this weekend.   Per MD, patient is now interested in [facility]. Spoke to [place] in Admissions who stated the earliest they would have a bed is Tuesday, but she will review the referral today.     Expected Discharge Plan: Skilled Nursing Facility Barriers to Discharge: Continued Medical Work up  Expected Discharge Plan and Services Expected Discharge Plan: Skilled Nursing Facility   Discharge Planning Services: CM Consult Post Acute Care Choice: Skilled Nursing Facility Living arrangements for the past 2 months: Single Family Home                                       Social Determinants of Health (SDOH) Interventions    Readmission Risk Interventions No flowsheet data found.

## 2021-08-24 NOTE — Progress Notes (Signed)
Subjective: 2 Days Post-Op Procedure(s) (LRB): INTRAMEDULLARY (IM) NAIL INTERTROCHANTRIC (Right) Patient is alert and oriented today.  Hemoglobin is down to 7.3 so we will get a unit of blood transfused.  Dressings are dry.  There is not much swelling.  Range of motion is fair.  Neurovascular status good distally.  Patient reports pain as mild.  Objective:   VITALS:   Vitals:   08/24/21 0804 08/24/21 1057  BP: (!) 104/55 118/62  Pulse: 68 79  Resp: 15 18  Temp: 98.4 F (36.9 C) 98.3 F (36.8 C)  SpO2: 98% 100%    Neurologically intact Incision: dressing C/D/I  LABS Recent Labs    08/22/21 0547 08/23/21 0506 08/24/21 0423  HGB 8.7* 8.1* 7.3*  HCT 26.2* 24.0* 22.6*  WBC 11.2* 13.3* 8.8  PLT 176 192 162    Recent Labs    08/22/21 0547 08/23/21 0506 08/24/21 0423  NA 138 135 134*  K 3.8 3.6 3.6  BUN 39* 46* 44*  CREATININE 1.51* 1.44* 1.22  GLUCOSE 106* 105* 112*    No results for input(s): LABPT, INR in the last 72 hours.   Assessment/Plan: 2 Days Post-Op Procedure(s) (LRB): INTRAMEDULLARY (IM) NAIL INTERTROCHANTRIC (Right)   Advance diet Up with therapy Discharge to SNF

## 2021-08-24 NOTE — Progress Notes (Signed)
PROGRESS NOTE    Keith Morris  KCL:[id number] DOB: 05-24-1932 DOA: 08/21/2021 PCP: Pcp, No    Brief Narrative:   87 y.o. male with PMH significant of tobacco abuse presented to the ED s/p mechanical fall in the parking lot outside the barbershop 2 days ago.  He denies any head trauma or head injury.  He denies any loss of consciousness, nausea or vomiting.  Patient has developed right thigh pain which was manageable initially at assisted home. Patient has been remained in bed for last 2 days,  unable to walk due to the pain in the right hip.  He has not been able to walk to eat or drink.  Patient was brought in the ED by his grandson.  Seen in consultation by orthopedics in the emergency department.  Discussed nature of fracture and recommended intramedullary fixation.  Patient successfully underwent intramedullary nail fixation for right intertrochanteric hip fracture on 11/17.  Tolerated procedure well with no immediate postoperative complications.  Assessment & Plan:   Principal Problem:   Closed femur fracture (HCC) Active Problems:   Fall   AKI (acute kidney injury) (HCC)   Tobacco abuse   Pressure injury of skin   Malnutrition of moderate degree  Right intertrochanteric femur fracture s/p mechanical fall Patient reported fall 2 days prior to presentation in the parking lot. Pain was initially manageable but has gotten worse. X-ray shows intertrochanteric fracture of right femur. Orthopedics consulted in ED Status post IM fixation on 11/17 Doing well postoperatively Pain minimal Plan: Continue therapy evaluations Multimodal pain control Bowel regimen TOC consult Patient has insensate authorization for [employer] [facility] [employer].  TOC to reach out to facility to acquire when bed will be available.  Once patient has BM he will be medically stable for discharge  Acute postoperative blood loss anemia Hemoglobin 7.3 on 11/19 Baseline hemoglobin appears to be in the tens Plan: Transfuse 1  unit PRBC Posttransfusion H&H A.m. CBC  Stage 2 sacral pressure injury, present on admission  WOC consulted   Mild rhabdomyolysis, resolved CK4 93, consistent with right thigh pain Plan: No further IV fluids Encourage p.o. fluid intake   AKI, improved Increase in serum creatinine to ?1.5 times baseline, which is known or presumed to    have occurred within the prior seven days Suspect prerenal azotemia Kidney function improved Plan: Avoid nephrotoxins Avoid hypotension    Leukocytosis Suspect reactive leukocytosis No clinical indication of infection   Tobacco abuse Counseling completed.   DVT prophylaxis: SQ Lovenox Code Status: Full Family Communication: daughter Cala Bradford [phone] on 11/19 Disposition Plan: Status is: Inpatient  Remains inpatient appropriate because: Hip fracture status post IM nail.  Postoperative day #2.  Doing well postoperatively.  Pending BM.  Level of care: Med-Surg  Consultants:  Orthopedic surgery  Procedures:  IM nailing 11/17  Antimicrobials: None   Subjective: Patient seen and examined.  Sitting up in bed.  No visible distress.  Answers questions appropriately.  Pain well controlled.  Objective: Vitals:   08/23/21 1933 08/23/21 2310 08/24/21 0507 08/24/21 0804  BP: 113/65 (!) 120/53 (!) 121/59 (!) 104/55  Pulse: 75 69 66 68  Resp: 18 16 16 15   Temp: 98.3 F (36.8 C) 97.6 F (36.4 C) 97.7 F (36.5 C) 98.4 F (36.9 C)  TempSrc: Oral Oral Oral Oral  SpO2: 95% 96% 97% 98%  Weight:      Height:        Intake/Output Summary (Last 24 hours) at 08/24/2021 1016 Last data filed at  08/24/2021 0511 Gross per 24 hour  Intake 0 ml  Output 625 ml  Net -625 ml   Filed Weights   08/21/21 1627 08/22/21 1036  Weight: 50 kg 68 kg    Examination:  General exam: Appears calm and comfortable  Respiratory system: Clear to auscultation. Respiratory effort normal. Cardiovascular system: S1-S2, RRR, no murmurs, no pedal  edema Gastrointestinal system: Abdomen is nondistended, soft and nontender. No organomegaly or masses felt. Normal bowel sounds heard. Central nervous system: Alert and oriented. No focal neurological deficits. Extremities: Right lower extremity Skin: Scattered ecchymoses both upper arms with skin tearing Psychiatry: Judgement and insight appear normal. Mood & affect appropriate.     Data Reviewed: I have personally reviewed following labs and imaging studies  CBC: Recent Labs  Lab 08/21/21 1716 08/22/21 0547 08/23/21 0506 08/24/21 0423  WBC 14.3* 11.2* 13.3* 8.8  NEUTROABS 11.8*  --   --   --   HGB 10.6* 8.7* 8.1* 7.3*  HCT 31.7* 26.2* 24.0* 22.6*  MCV 97.8 96.7 95.6 97.0  PLT 206 176 192 162   Basic Metabolic Panel: Recent Labs  Lab 08/21/21 1716 08/22/21 0547 08/23/21 0506 08/24/21 0423  NA 138 138 135 134*  K 3.8 3.8 3.6 3.6  CL 108 110 108 108  CO2 22 21* 17* 21*  GLUCOSE 118* 106* 105* 112*  BUN 34* 39* 46* 44*  CREATININE 1.60* 1.51* 1.44* 1.22  CALCIUM 8.6* 7.9* 7.6* 7.9*  MG  --  2.2  --   --   PHOS  --  3.6  --   --    GFR: Estimated Creatinine Clearance: 41 mL/min (by C-G formula based on SCr of 1.22 mg/dL). Liver Function Tests: Recent Labs  Lab 08/21/21 1716  AST 25  ALT 10  ALKPHOS 84  BILITOT 1.3*  PROT 7.6  ALBUMIN 3.9   No results for input(s): LIPASE, AMYLASE in the last 168 hours. No results for input(s): AMMONIA in the last 168 hours. Coagulation Profile: No results for input(s): INR, PROTIME in the last 168 hours. Cardiac Enzymes: Recent Labs  Lab 08/21/21 1716 08/22/21 0547  CKTOTAL 493* 352   BNP (last 3 results) No results for input(s): PROBNP in the last 8760 hours. HbA1C: No results for input(s): HGBA1C in the last 72 hours. CBG: No results for input(s): GLUCAP in the last 168 hours. Lipid Profile: No results for input(s): CHOL, HDL, LDLCALC, TRIG, CHOLHDL, LDLDIRECT in the last 72 hours. Thyroid Function Tests: No  results for input(s): TSH, T4TOTAL, FREET4, T3FREE, THYROIDAB in the last 72 hours. Anemia Panel: No results for input(s): VITAMINB12, FOLATE, FERRITIN, TIBC, IRON, RETICCTPCT in the last 72 hours. Sepsis Labs: No results for input(s): PROCALCITON, LATICACIDVEN in the last 168 hours.  Recent Results (from the past 240 hour(s))  Resp Panel by RT-PCR (Flu A&B, Covid) Nasopharyngeal Swab     Status: None   Collection Time: 08/21/21  5:16 PM   Specimen: Nasopharyngeal Swab; Nasopharyngeal(NP) swabs in vial transport medium  Result Value Ref Range Status   SARS Coronavirus 2 by RT PCR NEGATIVE NEGATIVE Final    Comment: (NOTE) SARS-CoV-2 target nucleic acids are NOT DETECTED.  The SARS-CoV-2 RNA is generally detectable in upper respiratory specimens during the acute phase of infection. The lowest concentration of SARS-CoV-2 viral copies this assay can detect is 138 copies/mL. A negative result does not preclude SARS-Cov-2 infection and should not be used as the sole basis for treatment or other patient management decisions. A negative  result may occur with  improper specimen collection/handling, submission of specimen other than nasopharyngeal swab, presence of viral mutation(s) within the areas targeted by this assay, and inadequate number of viral copies(<138 copies/mL). A negative result must be combined with clinical observations, patient history, and epidemiological information. The expected result is Negative.  Fact Sheet for Patients:  BloggerCourse.com  Fact Sheet for Healthcare Providers:  SeriousBroker.it  This test is no t yet approved or cleared by the [place] FDA and  has been authorized for detection and/or diagnosis of SARS-CoV-2 by FDA under an Emergency Use Authorization (EUA). This EUA will remain  in effect (meaning this test can be used) for the duration of the COVID-19 declaration under Section 564(b)(1) of  the Act, 21 U.S.C.section 360bbb-3(b)(1), unless the authorization is terminated  or revoked sooner.       Influenza A by PCR NEGATIVE NEGATIVE Final   Influenza B by PCR NEGATIVE NEGATIVE Final    Comment: (NOTE) The Xpert Xpress SARS-CoV-2/FLU/RSV plus assay is intended as an aid in the diagnosis of influenza from Nasopharyngeal swab specimens and should not be used as a sole basis for treatment. Nasal washings and aspirates are unacceptable for Xpert Xpress SARS-CoV-2/FLU/RSV testing.  Fact Sheet for Patients: BloggerCourse.com  Fact Sheet for Healthcare Providers: SeriousBroker.it  This test is not yet approved or cleared by the [place] FDA and has been authorized for detection and/or diagnosis of SARS-CoV-2 by FDA under an Emergency Use Authorization (EUA). This EUA will remain in effect (meaning this test can be used) for the duration of the COVID-19 declaration under Section 564(b)(1) of the Act, 21 U.S.C. section 360bbb-3(b)(1), unless the authorization is terminated or revoked.  Performed at [facility], [address] [place], [place] [address]          Radiology Studies: DG C-Arm 1-60 Min-No Report  Result Date: 08/22/2021 Fluoroscopy was utilized by the requesting physician.  No radiographic interpretation.   DG C-Arm 1-60 Min-No Report  Result Date: 08/22/2021 Fluoroscopy was utilized by the requesting physician.  No radiographic interpretation.   DG HIP UNILAT WITH PELVIS 2-3 VIEWS RIGHT  Result Date: 08/22/2021 CLINICAL DATA:  Right hip intramedullary nail EXAM: DG HIP (WITH OR WITHOUT PELVIS) 2-3V RIGHT COMPARISON:  Pelvis radiographs obtained 1 day prior FINDINGS: Four C-arm fluoroscopic images were obtained intraoperatively and submitted for post operative interpretation. Images demonstrate interval intramedullary nail and screw fixation of the femur 4 fixation of a proximal  femoral fracture. Hardware alignment appears within expected limits, without evidence of immediate complication. Degenerative changes are seen about the hip and knee. Please see the performing provider's procedural report for further detail. IMPRESSION: Interval intramedullary nail and screw fixation of the proximal right femoral fracture. Electronically Signed   By: Lesia Hausen M.D.   On: 08/22/2021 14:14   DG FEMUR, MIN 2 VIEWS RIGHT  Result Date: 08/22/2021 CLINICAL DATA:  Status post intramedullary rod fixation of right femur. EXAM: RIGHT FEMUR 2 VIEWS COMPARISON:  August 21, 2021. FINDINGS: Status post surgical internal fixation of proximal right femoral intertrochanteric fracture as well as intramedullary rod fixation of the right femur. Good alignment of fracture components is noted. Postsurgical changes are seen in the surrounding soft tissues. IMPRESSION: Status post surgical internal fixation of proximal right femoral intertrochanteric fracture with intramedullary rod fixation of the right femur. Electronically Signed   By: Lupita Raider M.D.   On: 08/22/2021 15:24        Scheduled Meds:  sodium chloride   Intravenous Once   aspirin  81 mg Oral Daily   Chlorhexidine Gluconate Cloth  6 each Topical Daily   docusate sodium  100 mg Oral BID   enoxaparin (LOVENOX) injection  40 mg Subcutaneous Q24H   feeding supplement  237 mL Oral BID BM   multivitamin with minerals  1 tablet Oral Daily   polyethylene glycol  17 g Oral Daily   senna-docusate  1 tablet Oral BID   traMADol  50 mg Oral Q6H   Continuous Infusions:  methocarbamol (ROBAXIN) IV       LOS: 3 days    Time spent: 25 minutes    Tresa Moore, MD [facility]   If 7PM-7AM, please contact night-coverage  08/24/2021, 10:16 AM

## 2021-08-25 LAB — TYPE AND SCREEN
ABO/RH(D): O POS
Antibody Screen: NEGATIVE
Unit division: 0

## 2021-08-25 LAB — CBC
HCT: 24.4 % — ABNORMAL LOW (ref 39.0–52.0)
Hemoglobin: 8.1 g/dL — ABNORMAL LOW (ref 13.0–17.0)
MCH: 31.3 pg (ref 26.0–34.0)
MCHC: 33.2 g/dL (ref 30.0–36.0)
MCV: 94.2 fL (ref 80.0–100.0)
Platelets: 191 10*3/uL (ref 150–400)
RBC: 2.59 MIL/uL — ABNORMAL LOW (ref 4.22–5.81)
RDW: 14 % (ref 11.5–15.5)
WBC: 10.5 10*3/uL (ref 4.0–10.5)
nRBC: 0 % (ref 0.0–0.2)

## 2021-08-25 LAB — BPAM RBC
Blood Product Expiration Date: 202212212359
ISSUE DATE / TIME: 202211191054
Unit Type and Rh: 5100

## 2021-08-25 LAB — HEMOGLOBIN: Hemoglobin: 9.2 g/dL — ABNORMAL LOW (ref 13.0–17.0)

## 2021-08-25 NOTE — Progress Notes (Signed)
Subjective: 3 Days Post-Op Procedure(s) (LRB): INTRAMEDULLARY (IM) NAIL INTERTROCHANTRIC (Right) Patient is out of bed in a chair today.  He looks and feels better.  Hemoglobin is up to 8.1.  Dressings are dry.  Patient reports pain as mild.  Objective:   VITALS:   Vitals:   08/25/21 0741 08/25/21 1156  BP: (!) 111/56 (!) 100/48  Pulse: 68 75  Resp: 17 16  Temp: 97.8 F (36.6 C) 98.4 F (36.9 C)  SpO2: 97% 96%    Neurologically intact Sensation intact distally  LABS Recent Labs    08/23/21 0506 08/24/21 0423 08/24/21 1550 08/25/21 0548  HGB 8.1* 7.3* 9.4* 8.1*  HCT 24.0* 22.6* 28.1* 24.4*  WBC 13.3* 8.8  --  10.5  PLT 192 162  --  191    Recent Labs    08/23/21 0506 08/24/21 0423  NA 135 134*  K 3.6 3.6  BUN 46* 44*  CREATININE 1.44* 1.22  GLUCOSE 105* 112*    No results for input(s): LABPT, INR in the last 72 hours.   Assessment/Plan: 3 Days Post-Op Procedure(s) (LRB): INTRAMEDULLARY (IM) NAIL INTERTROCHANTRIC (Right)   Up with therapy Discharge to SNF

## 2021-08-25 NOTE — Progress Notes (Signed)
PROGRESS NOTE    Keith Morris  LZJ:[id number] DOB: 08/13/1932 DOA: 08/21/2021 PCP: Pcp, No    Brief Narrative:   87 y.o. male with PMH significant of tobacco abuse presented to the ED s/p mechanical fall in the parking lot outside the barbershop 2 days ago.  He denies any head trauma or head injury.  He denies any loss of consciousness, nausea or vomiting.  Patient has developed right thigh pain which was manageable initially at assisted home. Patient has been remained in bed for last 2 days,  unable to walk due to the pain in the right hip.  He has not been able to walk to eat or drink.  Patient was brought in the ED by his grandson.  Seen in consultation by orthopedics in the emergency department.  Discussed nature of fracture and recommended intramedullary fixation.  Patient successfully underwent intramedullary nail fixation for right intertrochanteric hip fracture on 11/17.  Tolerated procedure well with no immediate postoperative complications. 11/20: Postoperative blood loss anemia with hemoglobin 7.3.  Transfuse 1 unit.  Initially responded to 9.2, subsequently dropped to 8.1  Assessment & Plan:   Principal Problem:   Closed femur fracture (HCC) Active Problems:   Fall   AKI (acute kidney injury) (HCC)   Tobacco abuse   Pressure injury of skin   Malnutrition of moderate degree  Right intertrochanteric femur fracture s/p mechanical fall Patient reported fall 2 days prior to presentation in the parking lot. Pain was initially manageable but has gotten worse. X-ray shows intertrochanteric fracture of right femur. Orthopedics consulted in ED Status post IM fixation on 11/17 Doing well postoperatively Pain minimal Successful BM 11/19 Plan: Continue therapy evaluations Multimodal pain control Bowel regimen TOC consult anticipate medical readiness for discharge 11/21 Patient has insensate authorization for [facility].  TOC to reach out to facility to acquire when bed will be  available.  Once patient has BM he will be medically stable for discharge  Acute postoperative blood loss anemia Hemoglobin 7.3 on 11/19 Baseline hemoglobin appears to be in the tens Transfused 1 unit PRBC on 11/19.  Initial follow-up hemoglobin 9.4, subsequently 8.1 Plan: Check p.m. CBC If hemoglobin less than 8 transfuse 1 unit  Stage 2 sacral pressure injury, present on admission  WOC consulted   Mild rhabdomyolysis, resolved CK4 93, consistent with right thigh pain Plan: No further IV fluids Encourage p.o. fluid intake   AKI, improved Increase in serum creatinine to ?1.5 times baseline, which is known or presumed to    have occurred within the prior seven days Suspect prerenal azotemia Kidney function improved Plan: Avoid nephrotoxins Avoid hypotension    Leukocytosis Suspect reactive leukocytosis No clinical indication of infection   Tobacco abuse Counseling completed.   DVT prophylaxis: SQ Lovenox Code Status: Full Family Communication: daughter Cala Bradford [phone] on 11/19 Disposition Plan: Status is: Inpatient  Remains inpatient appropriate because: A fracture status post IM nail.  Postoperative day #3.  Postoperatively doing well.  Some postoperative anemia.  Bowel movement successful.  If hemoglobin stable anticipate medical readiness for discharge 11/21  Level of care: Med-Surg  Consultants:  Orthopedic surgery  Procedures:  IM nailing 11/17  Antimicrobials: None   Subjective: Patient seen and examined.  Sleeping this morning.  Easily aroused.  No distress.  Pain well controlled  Objective: Vitals:   08/24/21 1500 08/24/21 1954 08/25/21 0454 08/25/21 0741  BP: (!) 131/57 (!) 126/52 (!) 95/55 (!) 111/56  Pulse: 72 68 74 68  Resp: 18 18  20 17  Temp: 98.1 F (36.7 C) 98.9 F (37.2 C) 98 F (36.7 C) 97.8 F (36.6 C)  TempSrc: Oral  Oral   SpO2: 99% 94% 97% 97%  Weight:      Height:        Intake/Output Summary (Last 24 hours) at  08/25/2021 1039 Last data filed at 08/25/2021 0200 Gross per 24 hour  Intake 570.5 ml  Output 200 ml  Net 370.5 ml   Filed Weights   08/21/21 1627 08/22/21 1036  Weight: 50 kg 68 kg    Examination:  General exam: No acute distress Respiratory system: Clear to auscultation. Respiratory effort normal. Cardiovascular system: S1-S2, RRR, no murmurs, no pedal edema Gastrointestinal system: Abdomen is nondistended, soft and nontender. No organomegaly or masses felt. Normal bowel sounds heard. Central nervous system: Alert and oriented. No focal neurological deficits. Extremities: Right lower extremity decreased ROM.  Surgical site CDI Skin: Scattered ecchymoses both upper arms with skin tearing Psychiatry: Judgement and insight appear normal. Mood & affect appropriate.     Data Reviewed: I have personally reviewed following labs and imaging studies  CBC: Recent Labs  Lab 08/21/21 1716 08/22/21 0547 08/23/21 0506 08/24/21 0423 08/24/21 1550 08/25/21 0548  WBC 14.3* 11.2* 13.3* 8.8  --  10.5  NEUTROABS 11.8*  --   --   --   --   --   HGB 10.6* 8.7* 8.1* 7.3* 9.4* 8.1*  HCT 31.7* 26.2* 24.0* 22.6* 28.1* 24.4*  MCV 97.8 96.7 95.6 97.0  --  94.2  PLT 206 176 192 162  --  191   Basic Metabolic Panel: Recent Labs  Lab 08/21/21 1716 08/22/21 0547 08/23/21 0506 08/24/21 0423  NA 138 138 135 134*  K 3.8 3.8 3.6 3.6  CL 108 110 108 108  CO2 22 21* 17* 21*  GLUCOSE 118* 106* 105* 112*  BUN 34* 39* 46* 44*  CREATININE 1.60* 1.51* 1.44* 1.22  CALCIUM 8.6* 7.9* 7.6* 7.9*  MG  --  2.2  --   --   PHOS  --  3.6  --   --    GFR: Estimated Creatinine Clearance: 41 mL/min (by C-G formula based on SCr of 1.22 mg/dL). Liver Function Tests: Recent Labs  Lab 08/21/21 1716  AST 25  ALT 10  ALKPHOS 84  BILITOT 1.3*  PROT 7.6  ALBUMIN 3.9   No results for input(s): LIPASE, AMYLASE in the last 168 hours. No results for input(s): AMMONIA in the last 168 hours. Coagulation  Profile: No results for input(s): INR, PROTIME in the last 168 hours. Cardiac Enzymes: Recent Labs  Lab 08/21/21 1716 08/22/21 0547  CKTOTAL 493* 352   BNP (last 3 results) No results for input(s): PROBNP in the last 8760 hours. HbA1C: No results for input(s): HGBA1C in the last 72 hours. CBG: No results for input(s): GLUCAP in the last 168 hours. Lipid Profile: No results for input(s): CHOL, HDL, LDLCALC, TRIG, CHOLHDL, LDLDIRECT in the last 72 hours. Thyroid Function Tests: No results for input(s): TSH, T4TOTAL, FREET4, T3FREE, THYROIDAB in the last 72 hours. Anemia Panel: No results for input(s): VITAMINB12, FOLATE, FERRITIN, TIBC, IRON, RETICCTPCT in the last 72 hours. Sepsis Labs: No results for input(s): PROCALCITON, LATICACIDVEN in the last 168 hours.  Recent Results (from the past 240 hour(s))  Resp Panel by RT-PCR (Flu A&B, Covid) Nasopharyngeal Swab     Status: None   Collection Time: 08/21/21  5:16 PM   Specimen: Nasopharyngeal Swab; Nasopharyngeal(NP) swabs in  vial transport medium  Result Value Ref Range Status   SARS Coronavirus 2 by RT PCR NEGATIVE NEGATIVE Final    Comment: (NOTE) SARS-CoV-2 target nucleic acids are NOT DETECTED.  The SARS-CoV-2 RNA is generally detectable in upper respiratory specimens during the acute phase of infection. The lowest concentration of SARS-CoV-2 viral copies this assay can detect is 138 copies/mL. A negative result does not preclude SARS-Cov-2 infection and should not be used as the sole basis for treatment or other patient management decisions. A negative result may occur with  improper specimen collection/handling, submission of specimen other than nasopharyngeal swab, presence of viral mutation(s) within the areas targeted by this assay, and inadequate number of viral copies(<138 copies/mL). A negative result must be combined with clinical observations, patient history, and epidemiological information. The expected result  is Negative.  Fact Sheet for Patients:  BloggerCourse.com  Fact Sheet for Healthcare Providers:  SeriousBroker.it  This test is no t yet approved or cleared by the [place] FDA and  has been authorized for detection and/or diagnosis of SARS-CoV-2 by FDA under an Emergency Use Authorization (EUA). This EUA will remain  in effect (meaning this test can be used) for the duration of the COVID-19 declaration under Section 564(b)(1) of the Act, 21 U.S.C.section 360bbb-3(b)(1), unless the authorization is terminated  or revoked sooner.       Influenza A by PCR NEGATIVE NEGATIVE Final   Influenza B by PCR NEGATIVE NEGATIVE Final    Comment: (NOTE) The Xpert Xpress SARS-CoV-2/FLU/RSV plus assay is intended as an aid in the diagnosis of influenza from Nasopharyngeal swab specimens and should not be used as a sole basis for treatment. Nasal washings and aspirates are unacceptable for Xpert Xpress SARS-CoV-2/FLU/RSV testing.  Fact Sheet for Patients: BloggerCourse.com  Fact Sheet for Healthcare Providers: SeriousBroker.it  This test is not yet approved or cleared by the [place] FDA and has been authorized for detection and/or diagnosis of SARS-CoV-2 by FDA under an Emergency Use Authorization (EUA). This EUA will remain in effect (meaning this test can be used) for the duration of the COVID-19 declaration under Section 564(b)(1) of the Act, 21 U.S.C. section 360bbb-3(b)(1), unless the authorization is terminated or revoked.  Performed at [facility], [address] [place], [place] [address]          Radiology Studies: No results found.      Scheduled Meds:  aspirin  81 mg Oral Daily   Chlorhexidine Gluconate Cloth  6 each Topical Daily   docusate sodium  100 mg Oral BID   enoxaparin (LOVENOX) injection  40 mg Subcutaneous Q24H   feeding supplement   237 mL Oral BID BM   multivitamin with minerals  1 tablet Oral Daily   polyethylene glycol  17 g Oral Daily   senna-docusate  1 tablet Oral BID   traMADol  50 mg Oral Q6H   Continuous Infusions:  methocarbamol (ROBAXIN) IV       LOS: 4 days    Time spent: 25 minutes    Tresa Moore, MD [facility]   If 7PM-7AM, please contact night-coverage  08/25/2021, 10:39 AM

## 2021-08-25 NOTE — TOC Progression Note (Signed)
Transition of Care [facility]) - Progression Note    Patient Details  Name: Keith Morris MRN: [id number] Date of Birth: [birth year]  Transition of Care [facility]) CM/SW [place], LCSW Phone Number: 08/25/2021, 11:59 AM  Clinical Narrative:   Attempted calls to [facility], and main # at [facility] again today. Unable to reach anyone about admissions. Spoke to [place] at [facility] who reported she can accept patient, would definitely have a bed on Tuesday. Possibly Monday. Per MD, patient should be medically ready on Monday.  Met with patient at bedside and spoke with daughter via phone. Both would like patient to go to [facility], not [facility], due to it being closer to daughter's home.  [facility] to change auth to [facility]. Spoke to [employer]. He stated he will put a note in patient's file to change SNF. CSW also sent note in portal requesting this change.    Expected Discharge Plan: [place] Barriers to Discharge: Continued Medical Work up  Expected Discharge Plan and Services Expected Discharge Plan: [place]   Discharge Planning Services: CM Consult Post Acute Care Choice: [place] Living arrangements for the past 2 months: Single Family Home                                       Social Determinants of Health (SDOH) Interventions    Readmission Risk Interventions No flowsheet data found.

## 2021-08-26 LAB — CBC WITH DIFFERENTIAL/PLATELET
Abs Immature Granulocytes: 0.02 10*3/uL (ref 0.00–0.07)
Basophils Absolute: 0 10*3/uL (ref 0.0–0.1)
Basophils Relative: 0 %
Eosinophils Absolute: 0.2 10*3/uL (ref 0.0–0.5)
Eosinophils Relative: 2 %
HCT: 23.2 % — ABNORMAL LOW (ref 39.0–52.0)
Hemoglobin: 7.7 g/dL — ABNORMAL LOW (ref 13.0–17.0)
Immature Granulocytes: 0 %
Lymphocytes Relative: 22 %
Lymphs Abs: 1.8 10*3/uL (ref 0.7–4.0)
MCH: 31.7 pg (ref 26.0–34.0)
MCHC: 33.2 g/dL (ref 30.0–36.0)
MCV: 95.5 fL (ref 80.0–100.0)
Monocytes Absolute: 0.5 10*3/uL (ref 0.1–1.0)
Monocytes Relative: 7 %
Neutro Abs: 5.5 10*3/uL (ref 1.7–7.7)
Neutrophils Relative %: 69 %
Platelets: 194 10*3/uL (ref 150–400)
RBC: 2.43 MIL/uL — ABNORMAL LOW (ref 4.22–5.81)
RDW: 14.2 % (ref 11.5–15.5)
WBC: 8 10*3/uL (ref 4.0–10.5)
nRBC: 0 % (ref 0.0–0.2)

## 2021-08-26 LAB — HEMOGLOBIN AND HEMATOCRIT, BLOOD
HCT: 31.1 % — ABNORMAL LOW (ref 39.0–52.0)
Hemoglobin: 10.4 g/dL — ABNORMAL LOW (ref 13.0–17.0)

## 2021-08-26 LAB — RESP PANEL BY RT-PCR (FLU A&B, COVID) ARPGX2
Influenza A by PCR: NEGATIVE
Influenza B by PCR: NEGATIVE
SARS Coronavirus 2 by RT PCR: NEGATIVE

## 2021-08-26 LAB — PREPARE RBC (CROSSMATCH)

## 2021-08-26 MED ORDER — SODIUM CHLORIDE 0.9% IV SOLUTION: Freq: Once | INTRAVENOUS | Status: DC

## 2021-08-26 NOTE — Progress Notes (Signed)
Occupational Therapy Treatment Patient Details Name: Keith Morris MRN: [id number] DOB: March 15, 1932 Today's Date: 08/26/2021   History of present illness Keith Morris is an 21yoM who comes to [facility] on 11/16 after mechanical fall, imaging revealing of subsequent rt hip fracture. Pt returned to home with assistance and after 2 days of being unable to walk then came to the ED. PMH: tobacco use, 2 episodes of syncope in August 2021. pt went to OR on 11/17 c Dr. Wynetta Emery for IM nail fixation Rt femur, then made WBAT.   OT comments  Pt seen for OT Tx this date to f/u re; safety with ADLs/ADL mobility. OT provides built up handles for eating utensils to pt for breakfast (in high fowler's position) and he is noted to reduce spillage to only about ~5-10% of meal. OT then engages pt in sup to sit with MOD A to come to EOB sitting. Pt requires MOD A for UB bathing tasks. He requries 2p MAX A to CTS x1 trial with RW and is noted to have heavy posterior lean even with blocking feet to prevent sliding. Pt returned to bed with MIN A. All needs met and in reach. LPN reports she is prepping in give pt blood. Will continue to follow acutely.    Recommendations for follow up therapy are one component of a multi-disciplinary discharge planning process, led by the attending physician.  Recommendations may be updated based on patient status, additional functional criteria and insurance authorization.    Follow Up Recommendations  Skilled nursing-short term rehab (<3 hours/day)    Assistance Recommended at Discharge Intermittent Supervision/Assistance  Equipment Recommendations  Other (comment) (defer)    Recommendations for Other Services      Precautions / Restrictions Precautions Precautions: Fall Restrictions Weight Bearing Restrictions: Yes RLE Weight Bearing: Weight bearing as tolerated       Mobility Bed Mobility Overal bed mobility: Needs Assistance Bed Mobility: Supine to Sit     Supine to sit:  Mod assist Sit to supine: Min assist   General bed mobility comments: good effort to get to EOB    Transfers Overall transfer level: Needs assistance Equipment used: Rolling walker (2 wheels) Transfers: Sit to/from Stand Sit to Stand: Mod assist;Max assist;+2 physical assistance           General transfer comment: blocking feet for initial CTS, struggles to advance feet for small side steps d/t heavy posterior lean.     Balance Overall balance assessment: Needs assistance Sitting-balance support: No upper extremity supported;Feet supported Sitting balance-Leahy Scale: Good     Standing balance support: Bilateral upper extremity supported Standing balance-Leahy Scale: Zero Standing balance comment: MAX A +2 to sustain static stand                           ADL either performed or assessed with clinical judgement   ADL Overall ADL's : Needs assistance/impaired Eating/Feeding: Set up;With adaptive utensils Eating/Feeding Details (indicate cue type and reason): high fowler's position, OT provides built up haandles for utensils and pt noted to be more successful with scooping with decreased spillage to only about ~5-10% of breakfast items.     Upper Body Bathing: Moderate assistance;Sitting Upper Body Bathing Details (indicate cue type and reason): EOB sitting with F balance with MOD A for UB bathing (posterior/upper d/t limited ROM)  Extremity/Trunk Assessment              Vision       Perception     Praxis      Cognition Arousal/Alertness: Awake/alert Behavior During Therapy: WFL for tasks assessed/performed Overall Cognitive Status: Within Functional Limits for tasks assessed                                 General Comments: Pt is conversational and participatory, follows commands well. Oriented x3, but requires increased time for processing with commands and is possibly somewhat [facility]           Exercises Other Exercises Other Exercises: OT engages pt in sup to sit with MOD A and UB bathing tasks with MOD A. He requries 2p MAX A to CTS x1 trial and is noted to have heavy posterior lean even with blocking feet to prevent sliding. Pt returned to bed with MIN A. All needs met and in reach. LPN reports she is prepping in give pt blood.   Shoulder Instructions       General Comments      Pertinent Vitals/ Pain       Pain Assessment: Faces Faces Pain Scale: Hurts a little bit Pain Location: BLEs in wt bearing Pain Descriptors / Indicators: Aching;Burning;Constant;Discomfort Pain Intervention(s): Limited activity within patient's tolerance;Monitored during session;Repositioned  Home Living                                          Prior Functioning/Environment              Frequency  Min 2X/week        Progress Toward Goals  OT Goals(current goals can now be found in the care plan section)  Progress towards OT goals: Progressing toward goals  Acute Rehab OT Goals Patient Stated Goal: to be able to stand better OT Goal Formulation: With patient Time For Goal Achievement: 09/06/21 Potential to Achieve Goals: Good  Plan Discharge plan remains appropriate    Co-evaluation                 AM-PAC OT "6 Clicks" Daily Activity     Outcome Measure   Help from another person eating meals?: A Little Help from another person taking care of personal grooming?: A Little Help from another person toileting, which includes using toliet, bedpan, or urinal?: A Lot Help from another person bathing (including washing, rinsing, drying)?: A Lot Help from another person to put on and taking off regular upper body clothing?: A Little Help from another person to put on and taking off regular lower body clothing?: A Lot 6 Click Score: 15    End of Session Equipment Utilized During Treatment: Gait belt;Rolling walker (2 wheels)  OT Visit Diagnosis:  Other abnormalities of gait and mobility (R26.89);Muscle weakness (generalized) (M62.81)   Activity Tolerance Patient tolerated treatment well   Patient Left with call bell/phone within reach;in bed;with bed alarm set   Nurse Communication Mobility status        Time: 4034-7425 OT Time Calculation (min): 13 min  Charges: OT General Charges $OT Visit: 1 Visit OT Treatments $Self Care/Home Management : 8-22 mins  Gerrianne Scale, [place], OTR/L ascom [phone] 08/26/21, 2:29 PM

## 2021-08-26 NOTE — Plan of Care (Signed)

## 2021-08-26 NOTE — Progress Notes (Signed)
Physical Therapy Treatment Patient Details Name: Keith Morris MRN: [id number] DOB: [birth year] Today's Date: 08/26/2021   History of Present Illness Keith Morris is an 89yoM who comes to [facility] on 11/16 after mechanical fall, imaging revealing of subsequent rt hip fracture. Pt returned to home with assistance and after 2 days of being unable to walk then came to the ED. PMH: tobacco use, 2 episodes of syncope in August 2021. pt went to OR on 11/17 c Dr. Scherry Ran for IM nail fixation Rt femur, then made WBAT.    PT Comments    Pt sitting EOB with OT upon arrival.  Stood briefly with +2 max assist for pericare and to attempt to sidestep up to [facility].  He required heavy assist to stand and is unable to move feet to attempt steps.  Heavy post left lean.  He is steady in sitting and is able to get legs back into bed on his own with only min assist to reposition.  He is awaiting blood transfusion so he remains in bed for interventions.   Recommendations for follow up therapy are one component of a multi-disciplinary discharge planning process, led by the attending physician.  Recommendations may be updated based on patient status, additional functional criteria and insurance authorization.  Follow Up Recommendations  Skilled nursing-short term rehab (<3 hours/day)     Assistance Recommended at Discharge    Equipment Recommendations       Recommendations for Other Services       Precautions / Restrictions Precautions Precautions: Fall Restrictions Weight Bearing Restrictions: Yes RLE Weight Bearing: Weight bearing as tolerated     Mobility  Bed Mobility Overal bed mobility: Needs Assistance Bed Mobility: Sit to Supine       Sit to supine: Min assist   General bed mobility comments: did well getting legs back into bed and only slight assist to reposition and for cues.  increased time given    Transfers Overall transfer level: Needs assistance Equipment used: Rolling walker (2  wheels) Transfers: Sit to/from Stand Sit to Stand: Max assist;From elevated surface;+2 physical assistance                Ambulation/Gait               General Gait Details: unable to sidestep today to repostion higher in bed   Stairs             Wheelchair Mobility    Modified Rankin (Stroke Patients Only)       Balance Overall balance assessment: Needs assistance Sitting-balance support: No upper extremity supported;Feet supported Sitting balance-Leahy Scale: Good     Standing balance support: Bilateral upper extremity supported Standing balance-Leahy Scale: Zero Standing balance comment: full assist of staff to remain upright today.                            Cognition Arousal/Alertness: Awake/alert Behavior During Therapy: WFL for tasks assessed/performed Overall Cognitive Status: Within Functional Limits for tasks assessed                                          Exercises Other Exercises Other Exercises: BLE AROM x 10 supine    General Comments        Pertinent Vitals/Pain Pain Assessment: No/denies pain    Home Living  Prior Function            PT Goals (current goals can now be found in the care plan section) Progress towards PT goals: Progressing toward goals    Frequency    7X/week      PT Plan Current plan remains appropriate    Co-evaluation              AM-PAC PT "6 Clicks" Mobility   Outcome Measure  Help needed turning from your back to your side while in a flat bed without using bedrails?: A Little Help needed moving from lying on your back to sitting on the side of a flat bed without using bedrails?: A Little Help needed moving to and from a bed to a chair (including a wheelchair)?: Total Help needed standing up from a chair using your arms (e.g., wheelchair or bedside chair)?: A Lot Help needed to walk in hospital room?: Total Help  needed climbing 3-5 steps with a railing? : Total 6 Click Score: 11    End of Session Equipment Utilized During Treatment: Gait belt Activity Tolerance: Patient tolerated treatment well Patient left: in bed;with call bell/phone within reach;with bed alarm set Nurse Communication: Mobility status PT Visit Diagnosis: Unsteadiness on feet (R26.81);Other abnormalities of gait and mobility (R26.89);Difficulty in walking, not elsewhere classified (R26.2);Muscle weakness (generalized) (M62.81);History of falling (Z91.81)     Time: 5790-3833 PT Time Calculation (min) (ACUTE ONLY): 10 min  Charges:  $Therapeutic Exercise: 8-22 mins                    Danielle Dess, PTA 08/26/21, 10:50 AM

## 2021-08-26 NOTE — Progress Notes (Signed)
Physical Therapy Treatment Patient Details Name: Keith Morris MRN: [id number] DOB: 1931-10-11 Today's Date: 08/26/2021   History of Present Illness Keith Morris is an 76yoM who comes to [facility] on 11/16 after mechanical fall, imaging revealing of subsequent rt hip fracture. Pt returned to home with assistance and after 2 days of being unable to walk then came to the ED. PMH: tobacco use, 2 episodes of syncope in August 2021. pt went to OR on 11/17 c Dr. Wynetta Emery for IM nail fixation Rt femur, then made WBAT.    PT Comments    Participated in exercises as described below.  To EOB with mod a x 1 and good effort by patient.  Steady in sitting.  Stood with mod a x 2 - tech in to assist and is able to transfer to chair at bedside with heavy assist and poor quality steps. Pt enjoys sitting up in chair and needs are met.   Recommendations for follow up therapy are one component of a multi-disciplinary discharge planning process, led by the attending physician.  Recommendations may be updated based on patient status, additional functional criteria and insurance authorization.  Follow Up Recommendations  Skilled nursing-short term rehab (<3 hours/day)     Assistance Recommended at Discharge Frequent or constant Supervision/Assistance  Equipment Recommendations       Recommendations for Other Services       Precautions / Restrictions Precautions Precautions: Fall Restrictions Weight Bearing Restrictions: Yes RLE Weight Bearing: Weight bearing as tolerated     Mobility  Bed Mobility Overal bed mobility: Needs Assistance Bed Mobility: Supine to Sit     Supine to sit: Mod assist Sit to supine: Min assist   General bed mobility comments: good effort to get to EOB    Transfers Overall transfer level: Needs assistance Equipment used: Rolling walker (2 wheels) Transfers: Sit to/from Stand Sit to Stand: Mod assist;Max assist;+2 physical assistance           General transfer  comment: able to take some steps to chair today but generally poor quality with heavy assist    Ambulation/Gait               General Gait Details: no true gait   Stairs             Wheelchair Mobility    Modified Rankin (Stroke Patients Only)       Balance Overall balance assessment: Needs assistance Sitting-balance support: No upper extremity supported;Feet supported Sitting balance-Leahy Scale: Good     Standing balance support: Bilateral upper extremity supported Standing balance-Leahy Scale: Zero Standing balance comment: full assist of staff to remain upright today.                            Cognition Arousal/Alertness: Awake/alert Behavior During Therapy: WFL for tasks assessed/performed Overall Cognitive Status: Within Functional Limits for tasks assessed                                          Exercises Other Exercises Other Exercises: BLE AROM x 10 supine    General Comments        Pertinent Vitals/Pain Pain Assessment: Faces Faces Pain Scale: Hurts a little bit Pain Location: BLEs in wt bearing Pain Descriptors / Indicators: Aching;Burning;Constant;Discomfort Pain Intervention(s): Limited activity within patient's tolerance;Monitored during session;Repositioned    Home  Living                          Prior Function            PT Goals (current goals can now be found in the care plan section) Progress towards PT goals: Progressing toward goals    Frequency    7X/week      PT Plan Current plan remains appropriate    Co-evaluation              AM-PAC PT "6 Clicks" Mobility   Outcome Measure  Help needed turning from your back to your side while in a flat bed without using bedrails?: A Little Help needed moving from lying on your back to sitting on the side of a flat bed without using bedrails?: A Little Help needed moving to and from a bed to a chair (including a  wheelchair)?: Total Help needed standing up from a chair using your arms (e.g., wheelchair or bedside chair)?: A Lot Help needed to walk in hospital room?: Total Help needed climbing 3-5 steps with a railing? : Total 6 Click Score: 11    End of Session Equipment Utilized During Treatment: Gait belt Activity Tolerance: Patient tolerated treatment well Patient left: in chair;with call bell/phone within reach;with chair alarm set Nurse Communication: Mobility status PT Visit Diagnosis: Unsteadiness on feet (R26.81);Other abnormalities of gait and mobility (R26.89);Difficulty in walking, not elsewhere classified (R26.2);Muscle weakness (generalized) (M62.81);History of falling (Z91.81)     Time: 1610-9604 PT Time Calculation (min) (ACUTE ONLY): 10 min  Charges:  $Therapeutic Exercise: 8-22 mins                    Chesley Noon, PTA 08/26/21, 10:57 AM

## 2021-08-26 NOTE — Consult Note (Signed)
WOC Nurse Consult Note: Reason for Consult:Stage 2 partial thickness wound noted to sacrum. He sustained a fall in the community and then returned home where he stayed in bed for 2 days before coming to ED.  Wound type: trauma from fall vs pressure Pressure Injury POA: Yes Measurement:To be obtained today by bedside RN and documented on Nursing Flow Sheet.  Wound bed: Pink, red Drainage (amount, consistency, odor) Small serosanguinous Periwound: blanching erythema Dressing procedure/placement/frequency: Guidance for the care of Stage 2 pressure injuries can also be found in the Skin Care Standing Order Set. Nursing is provided with guidance via the Orders for turning and repositioning and to minimize time in the supine position. Topical care will be with an antimicrobial nonadherent dressing (Xeroform gauze, Lawson # [phone]) topped with dry gauze and covered with a silicone foam bordered dressing. The xeroform is to be changed daily, the silicone foam may be reused for up to 3 days. A pressure redistribution chair pad is provided for OOB use and is to transfer with the patient to the next care setting for continued pressure redistribution while OOB in chair.  I have communicated the request for Wound Measurements and Documentation to the Bedside nurses via Secure Chat.  WOC nursing team will not follow, but will remain available to this patient, the nursing and medical teams.  Please re-consult if needed. Thanks, Ladona Mow, MSN, RN, GNP, [place]  Pager# [phone]

## 2021-08-26 NOTE — Progress Notes (Signed)
Password established by daughter Cala Bradford on 08/26/21.

## 2021-08-26 NOTE — TOC Progression Note (Signed)
Transition of Care [facility]) - Progression Note    Patient Details  Name: Keith Morris MRN: [id number] Date of Birth: 1932/02/10  [facility] CM/SW Contact  Caryn Section, RN Phone Number: 08/26/2021, 9:48 AM  Clinical Narrative:   Spoke to patient's daughter, she clarified that although they were at [facility] prior, they feel that it would be best to have patient closest to them at [facility] this stay.  Verlon Au at [facility] verified that she can accept patient tomorrow.  TOC to follow.    Expected Discharge Plan: Skilled Nursing Facility Barriers to Discharge: Continued Medical Work up  Expected Discharge Plan and Services Expected Discharge Plan: Skilled Nursing Facility   Discharge Planning Services: CM Consult Post Acute Care Choice: Skilled Nursing Facility Living arrangements for the past 2 months: Single Family Home                                       Social Determinants of Health (SDOH) Interventions    Readmission Risk Interventions No flowsheet data found.

## 2021-08-26 NOTE — Progress Notes (Signed)
PROGRESS NOTE    Keith Morris  HYQ:[id number] DOB: 1931-12-07 DOA: 08/21/2021 PCP: Pcp, No    Brief Narrative:   87 y.o. male with PMH significant of tobacco abuse presented to the ED s/p mechanical fall in the parking lot outside the barbershop 2 days ago.  He denies any head trauma or head injury.  He denies any loss of consciousness, nausea or vomiting.  Patient has developed right thigh pain which was manageable initially at assisted home. Patient has been remained in bed for last 2 days,  unable to walk due to the pain in the right hip.  He has not been able to walk to eat or drink.  Patient was brought in the ED by his grandson.  Seen in consultation by orthopedics in the emergency department.  Discussed nature of fracture and recommended intramedullary fixation.  Patient successfully underwent intramedullary nail fixation for right intertrochanteric hip fracture on 11/17.  Tolerated procedure well with no immediate postoperative complications. 11/20: Postoperative blood loss anemia with hemoglobin 7.3.  Transfuse 1 unit.  Initially responded to 9.2, subsequently dropped to 8.1  Follow-up hemoglobin increased to 9 and then subsequently dropped to 7.7.  Pain well controlled.  Patient stable from a postoperative standpoint  Assessment & Plan:   Principal Problem:   Closed femur fracture (HCC) Active Problems:   Fall   AKI (acute kidney injury) (HCC)   Tobacco abuse   Pressure injury of skin   Malnutrition of moderate degree  Right intertrochanteric femur fracture s/p mechanical fall Patient reported fall 2 days prior to presentation in the parking lot. Pain was initially manageable but has gotten worse. X-ray shows intertrochanteric fracture of right femur. Orthopedics consulted in ED Status post IM fixation on 11/17 Doing well postoperatively Pain minimal Successful BM 11/19 Plan: Continue therapy evaluations Multimodal pain control Bowel regimen  anticipate discharge  11/22  Acute postoperative blood loss anemia Hemoglobin 7.3 on 11/19 Baseline hemoglobin appears to be in the tens Transfused 1 unit PRBC on 11/19.  Initial follow-up hemoglobin 9.4, subsequently 8.1.  Subsequently dropped back down to 7.7 Plan: Transfuse 1 unit PRBC Posttransfusion H&H Maintain hemoglobin greater than 8  Stage 2 sacral pressure injury, present on admission  WOC consulted   Mild rhabdomyolysis, resolved CK4 93, consistent with right thigh pain Plan: No further IV fluids Encourage p.o. fluid intake   AKI, improved Increase in serum creatinine to ?1.5 times baseline, which is known or presumed to    have occurred within the prior seven days Suspect prerenal azotemia Kidney function improved Plan: Avoid nephrotoxins Avoid hypotension    Leukocytosis Suspect reactive leukocytosis No clinical indication of infection   Tobacco abuse Counseling completed.   DVT prophylaxis: SQ Lovenox Code Status: Full Family Communication: daughter Cala Bradford [phone] on 11/19, 11/21 Disposition Plan: Status is: Inpatient  Remains inpatient appropriate because: Hip fracture status post IM nailing.  Postoperative day #4.  Doing well postoperatively.  Successful BM.  Having some postoperative anemia.  Transfuse 1 unit PRBC.  Recheck a.m. hemoglobin.  Anticipate medical readiness for discharge on 11/22. Level of care: Med-Surg  Consultants:  Orthopedic surgery  Procedures:  IM nailing 11/17  Antimicrobials: None   Subjective: Patient seen and examined.  Pain well controlled.  No distress.  No complaints  Objective: Vitals:   08/26/21 0014 08/26/21 0413 08/26/21 0724 08/26/21 1007  BP: (!) 103/54 (!) 95/53 (!) 112/58 101/68  Pulse: 65 66 70 82  Resp: 17 17 15 14   Temp: 97.8  F (36.6 C) 98.3 F (36.8 C) 98 F (36.7 C) 98 F (36.7 C)  TempSrc:    Oral  SpO2: 98% 98% 96% 95%  Weight:      Height:        Intake/Output Summary (Last 24 hours) at  08/26/2021 1020 Last data filed at 08/26/2021 3220 Gross per 24 hour  Intake 500 ml  Output 250 ml  Net 250 ml   Filed Weights   08/21/21 1627 08/22/21 1036  Weight: 50 kg 68 kg    Examination:  General exam: No acute distress Respiratory system: Lungs clear.  Normal work of breathing.  Room air Cardiovascular system: S1-S2, RRR, no murmurs, no pedal edema Gastrointestinal system: Abdomen is nondistended, soft and nontender. No organomegaly or masses felt. Normal bowel sounds heard. Central nervous system: Alert and oriented. No focal neurological deficits. Extremities: Right lower extremity decreased ROM.  Surgical site CDI Skin: Scattered ecchymoses both upper arms with skin tearing Psychiatry: Judgement and insight appear normal. Mood & affect appropriate.     Data Reviewed: I have personally reviewed following labs and imaging studies  CBC: Recent Labs  Lab 08/21/21 1716 08/22/21 0547 08/23/21 0506 08/24/21 0423 08/24/21 1550 08/25/21 0548 08/25/21 1416 08/26/21 0251  WBC 14.3* 11.2* 13.3* 8.8  --  10.5  --  8.0  NEUTROABS 11.8*  --   --   --   --   --   --  5.5  HGB 10.6* 8.7* 8.1* 7.3* 9.4* 8.1* 9.2* 7.7*  HCT 31.7* 26.2* 24.0* 22.6* 28.1* 24.4*  --  23.2*  MCV 97.8 96.7 95.6 97.0  --  94.2  --  95.5  PLT 206 176 192 162  --  191  --  194   Basic Metabolic Panel: Recent Labs  Lab 08/21/21 1716 08/22/21 0547 08/23/21 0506 08/24/21 0423  NA 138 138 135 134*  K 3.8 3.8 3.6 3.6  CL 108 110 108 108  CO2 22 21* 17* 21*  GLUCOSE 118* 106* 105* 112*  BUN 34* 39* 46* 44*  CREATININE 1.60* 1.51* 1.44* 1.22  CALCIUM 8.6* 7.9* 7.6* 7.9*  MG  --  2.2  --   --   PHOS  --  3.6  --   --    GFR: Estimated Creatinine Clearance: 41 mL/min (by C-G formula based on SCr of 1.22 mg/dL). Liver Function Tests: Recent Labs  Lab 08/21/21 1716  AST 25  ALT 10  ALKPHOS 84  BILITOT 1.3*  PROT 7.6  ALBUMIN 3.9   No results for input(s): LIPASE, AMYLASE in the last  168 hours. No results for input(s): AMMONIA in the last 168 hours. Coagulation Profile: No results for input(s): INR, PROTIME in the last 168 hours. Cardiac Enzymes: Recent Labs  Lab 08/21/21 1716 08/22/21 0547  CKTOTAL 493* 352   BNP (last 3 results) No results for input(s): PROBNP in the last 8760 hours. HbA1C: No results for input(s): HGBA1C in the last 72 hours. CBG: No results for input(s): GLUCAP in the last 168 hours. Lipid Profile: No results for input(s): CHOL, HDL, LDLCALC, TRIG, CHOLHDL, LDLDIRECT in the last 72 hours. Thyroid Function Tests: No results for input(s): TSH, T4TOTAL, FREET4, T3FREE, THYROIDAB in the last 72 hours. Anemia Panel: No results for input(s): VITAMINB12, FOLATE, FERRITIN, TIBC, IRON, RETICCTPCT in the last 72 hours. Sepsis Labs: No results for input(s): PROCALCITON, LATICACIDVEN in the last 168 hours.  Recent Results (from the past 240 hour(s))  Resp Panel by RT-PCR (Flu A&B,  Covid) Nasopharyngeal Swab     Status: None   Collection Time: 08/21/21  5:16 PM   Specimen: Nasopharyngeal Swab; Nasopharyngeal(NP) swabs in vial transport medium  Result Value Ref Range Status   SARS Coronavirus 2 by RT PCR NEGATIVE NEGATIVE Final    Comment: (NOTE) SARS-CoV-2 target nucleic acids are NOT DETECTED.  The SARS-CoV-2 RNA is generally detectable in upper respiratory specimens during the acute phase of infection. The lowest concentration of SARS-CoV-2 viral copies this assay can detect is 138 copies/mL. A negative result does not preclude SARS-Cov-2 infection and should not be used as the sole basis for treatment or other patient management decisions. A negative result may occur with  improper specimen collection/handling, submission of specimen other than nasopharyngeal swab, presence of viral mutation(s) within the areas targeted by this assay, and inadequate number of viral copies(<138 copies/mL). A negative result must be combined with clinical  observations, patient history, and epidemiological information. The expected result is Negative.  Fact Sheet for Patients:  BloggerCourse.com  Fact Sheet for Healthcare Providers:  SeriousBroker.it  This test is no t yet approved or cleared by the [place] FDA and  has been authorized for detection and/or diagnosis of SARS-CoV-2 by FDA under an Emergency Use Authorization (EUA). This EUA will remain  in effect (meaning this test can be used) for the duration of the COVID-19 declaration under Section 564(b)(1) of the Act, 21 U.S.C.section 360bbb-3(b)(1), unless the authorization is terminated  or revoked sooner.       Influenza A by PCR NEGATIVE NEGATIVE Final   Influenza B by PCR NEGATIVE NEGATIVE Final    Comment: (NOTE) The Xpert Xpress SARS-CoV-2/FLU/RSV plus assay is intended as an aid in the diagnosis of influenza from Nasopharyngeal swab specimens and should not be used as a sole basis for treatment. Nasal washings and aspirates are unacceptable for Xpert Xpress SARS-CoV-2/FLU/RSV testing.  Fact Sheet for Patients: BloggerCourse.com  Fact Sheet for Healthcare Providers: SeriousBroker.it  This test is not yet approved or cleared by the [place] FDA and has been authorized for detection and/or diagnosis of SARS-CoV-2 by FDA under an Emergency Use Authorization (EUA). This EUA will remain in effect (meaning this test can be used) for the duration of the COVID-19 declaration under Section 564(b)(1) of the Act, 21 U.S.C. section 360bbb-3(b)(1), unless the authorization is terminated or revoked.  Performed at [facility], [address] [place], [place] [address]          Radiology Studies: No results found.      Scheduled Meds:  sodium chloride   Intravenous Once   aspirin  81 mg Oral Daily   Chlorhexidine Gluconate Cloth  6 each Topical  Daily   docusate sodium  100 mg Oral BID   enoxaparin (LOVENOX) injection  40 mg Subcutaneous Q24H   feeding supplement  237 mL Oral BID BM   multivitamin with minerals  1 tablet Oral Daily   polyethylene glycol  17 g Oral Daily   senna-docusate  1 tablet Oral BID   traMADol  50 mg Oral Q6H   Continuous Infusions:  methocarbamol (ROBAXIN) IV       LOS: 5 days    Time spent: 25 minutes    Tresa Moore, MD [facility]   If 7PM-7AM, please contact night-coverage  08/26/2021, 10:20 AM

## 2021-08-27 DIAGNOSIS — S7291XD Unspecified fracture of right femur, subsequent encounter for closed fracture with routine healing: Secondary | ICD-10-CM | POA: Diagnosis not present

## 2021-08-27 DIAGNOSIS — R5381 Other malaise: Secondary | ICD-10-CM | POA: Diagnosis not present

## 2021-08-27 DIAGNOSIS — Z7401 Bed confinement status: Secondary | ICD-10-CM | POA: Diagnosis not present

## 2021-08-27 DIAGNOSIS — S72001D Fracture of unspecified part of neck of right femur, subsequent encounter for closed fracture with routine healing: Secondary | ICD-10-CM | POA: Diagnosis not present

## 2021-08-27 DIAGNOSIS — R1312 Dysphagia, oropharyngeal phase: Secondary | ICD-10-CM | POA: Diagnosis not present

## 2021-08-27 DIAGNOSIS — L89152 Pressure ulcer of sacral region, stage 2: Secondary | ICD-10-CM | POA: Diagnosis not present

## 2021-08-27 DIAGNOSIS — R531 Weakness: Secondary | ICD-10-CM | POA: Diagnosis not present

## 2021-08-27 DIAGNOSIS — Z743 Need for continuous supervision: Secondary | ICD-10-CM | POA: Diagnosis not present

## 2021-08-27 DIAGNOSIS — L89159 Pressure ulcer of sacral region, unspecified stage: Secondary | ICD-10-CM | POA: Diagnosis not present

## 2021-08-27 DIAGNOSIS — Z8781 Personal history of (healed) traumatic fracture: Secondary | ICD-10-CM | POA: Diagnosis not present

## 2021-08-27 DIAGNOSIS — E440 Moderate protein-calorie malnutrition: Secondary | ICD-10-CM | POA: Diagnosis not present

## 2021-08-27 DIAGNOSIS — F17210 Nicotine dependence, cigarettes, uncomplicated: Secondary | ICD-10-CM | POA: Diagnosis not present

## 2021-08-27 DIAGNOSIS — F32A Depression, unspecified: Secondary | ICD-10-CM | POA: Diagnosis not present

## 2021-08-27 DIAGNOSIS — A499 Bacterial infection, unspecified: Secondary | ICD-10-CM | POA: Diagnosis not present

## 2021-08-27 DIAGNOSIS — I70233 Atherosclerosis of native arteries of right leg with ulceration of ankle: Secondary | ICD-10-CM | POA: Diagnosis not present

## 2021-08-27 DIAGNOSIS — L97509 Non-pressure chronic ulcer of other part of unspecified foot with unspecified severity: Secondary | ICD-10-CM | POA: Diagnosis not present

## 2021-08-27 DIAGNOSIS — W19XXXD Unspecified fall, subsequent encounter: Secondary | ICD-10-CM | POA: Diagnosis not present

## 2021-08-27 DIAGNOSIS — Z87891 Personal history of nicotine dependence: Secondary | ICD-10-CM | POA: Diagnosis not present

## 2021-08-27 DIAGNOSIS — I70235 Atherosclerosis of native arteries of right leg with ulceration of other part of foot: Secondary | ICD-10-CM | POA: Diagnosis not present

## 2021-08-27 DIAGNOSIS — L89153 Pressure ulcer of sacral region, stage 3: Secondary | ICD-10-CM | POA: Diagnosis not present

## 2021-08-27 DIAGNOSIS — K5900 Constipation, unspecified: Secondary | ICD-10-CM | POA: Diagnosis not present

## 2021-08-27 DIAGNOSIS — R55 Syncope and collapse: Secondary | ICD-10-CM | POA: Diagnosis not present

## 2021-08-27 DIAGNOSIS — Z7982 Long term (current) use of aspirin: Secondary | ICD-10-CM | POA: Diagnosis not present

## 2021-08-27 DIAGNOSIS — D62 Acute posthemorrhagic anemia: Secondary | ICD-10-CM | POA: Diagnosis not present

## 2021-08-27 LAB — TYPE AND SCREEN
ABO/RH(D): O POS
Antibody Screen: NEGATIVE
Unit division: 0

## 2021-08-27 LAB — BPAM RBC
Blood Product Expiration Date: 202212212359
ISSUE DATE / TIME: 202211211308
Unit Type and Rh: 5100

## 2021-08-27 MED ORDER — ENOXAPARIN SODIUM 40 MG/0.4ML IJ SOSY: 40.0000 mg | PREFILLED_SYRINGE | INTRAMUSCULAR | 0 refills | Status: AC

## 2021-08-27 MED ORDER — SENNOSIDES-DOCUSATE SODIUM 8.6-50 MG PO TABS: 1.0000 | ORAL_TABLET | Freq: Every day | ORAL | Status: AC

## 2021-08-27 MED ORDER — BISACODYL 10 MG RE SUPP
10.0000 mg | Freq: Every day | RECTAL | 0 refills | Status: AC | PRN
Start: 2021-08-27 — End: ?

## 2021-08-27 MED ORDER — OXYCODONE HCL 5 MG PO TABS: 5.0000 mg | ORAL_TABLET | Freq: Three times a day (TID) | ORAL | 0 refills | Status: AC | PRN

## 2021-08-27 NOTE — Plan of Care (Signed)
  Problem: Clinical Measurements: Goal: Will remain free from infection Outcome: Not Progressing   Problem: Nutrition: Goal: Adequate nutrition will be maintained Outcome: Not Progressing   Problem: Elimination: Goal: Will not experience complications related to urinary retention Outcome: Not Progressing   Problem: Elimination: Goal: Will not experience complications related to bowel motility Outcome: Not Progressing   Problem: Safety: Goal: Ability to remain free from injury will improve Outcome: Not Progressing

## 2021-08-27 NOTE — Care Management Important Message (Signed)
Important Message  Patient Details  Name: Keith Morris MRN: [id number] Date of Birth: 1931-11-10   Medicare Important Message Given:  Yes     [name] [name] 08/27/2021, 12:16 PM

## 2021-08-27 NOTE — Plan of Care (Signed)
Patient discharged per MD orders at this time.All dc instructions, education and medications reviewed with the patient at the bedside.Patient expressed understanding and will comply with dc instructions.follow up appointments was also communicated to the Pt.no verbal c/o or any ssx of distress.Pt was discharged to the [facility] for PT/OT services per order. Report was called to [facility] before transport.patient  was transported by two EMS personnel on a stretcher.

## 2021-08-27 NOTE — TOC Transition Note (Signed)
Transition of Care [facility]) - CM/SW Discharge Note   Patient Details  Name: JONTAE ADEBAYO MRN: [id number] Date of Birth: Dec 05, 1931  Transition of Care [facility]) CM/SW Contact:  Caryn Section, RN Phone Number: 08/27/2021, 11:18 AM   Clinical Narrative:   Patient is transferring to [facility] today, room 507 as per Verlon Au.  EMS contacted, patient is 2nd on list to travel.  Daughter updated.        Barriers to Discharge: Continued Medical Work up   Patient Goals and CMS Choice     Choice offered to / list presented to : Adult Children  Discharge Placement                       Discharge Plan and Services   Discharge Planning Services: CM Consult Post Acute Care Choice: Skilled Nursing Facility                               Social Determinants of Health (SDOH) Interventions     Readmission Risk Interventions No flowsheet data found.

## 2021-08-27 NOTE — Discharge Summary (Signed)
Physician Discharge Summary  Keith Morris QQV:[id number] DOB: 10-24-1931 DOA: 08/21/2021  PCP: Oneita Hurt, No  Admit date: 08/21/2021 Discharge date: 08/27/2021  Admitted From: Home Disposition: SNF  Recommendations for Outpatient Follow-up:  Follow up with PCP in 1-2 weeks Follow-up orthopedics 2 weeks  Home Health: No Equipment/Devices: None  Discharge Condition: Stable CODE STATUS: Full Diet recommendation: Regular  Brief/Interim Summary:   87 y.o. male with PMH significant of tobacco abuse presented to the ED s/p mechanical fall in the parking lot outside the barbershop 2 days ago.  He denies any head trauma or head injury.  He denies any loss of consciousness, nausea or vomiting.  Patient has developed right thigh pain which was manageable initially at assisted home. Patient has been remained in bed for last 2 days,  unable to walk due to the pain in the right hip.  He has not been able to walk to eat or drink.  Patient was brought in the ED by his grandson.   Seen in consultation by orthopedics in the emergency department.  Discussed nature of fracture and recommended intramedullary fixation.  Patient successfully underwent intramedullary nail fixation for right intertrochanteric hip fracture on 11/17.  Tolerated procedure well with no immediate postoperative complications. 11/20: Postoperative blood loss anemia with hemoglobin 7.3.  Transfuse 1 unit.  Initially responded to 9.2, subsequently dropped to 8.1   Follow-up hemoglobin increased to 9 and then subsequently dropped to 7.7.  Pain well controlled.  No obvious bleeding.  Hemoglobin stable at time of discharge.  Stable for discharge to skilled nursing facility.   Discharge Diagnoses:  Principal Problem:   Closed femur fracture (HCC) Active Problems:   Fall   AKI (acute kidney injury) (HCC)   Tobacco abuse   Pressure injury of skin   Malnutrition of moderate degree  Right intertrochanteric femur fracture s/p mechanical  fall Patient reported fall 2 days prior to presentation in the parking lot. Pain was initially manageable but has gotten worse. X-ray shows intertrochanteric fracture of right femur. Orthopedics consulted in ED Status post IM fixation on 11/17 Doing well postoperatively Pain minimal Successful BM 11/19 Plan: Hemoglobin stable postoperatively.  Transferred to skilled nurse facility.  Multimodal pain control and bowel regimen.   Acute postoperative blood loss anemia Hemoglobin 7.3 on 11/19 Baseline hemoglobin appears to be in the tens Transfused 1 unit PRBC on 11/19.  Initial follow-up hemoglobin 9.4, subsequently 8.1.  Subsequently dropped back down to 7.7 Plan: Discharge to skilled nursing facility.  Intermittently monitor hemoglobin.  Maintain hemoglobin greater than 8.   Stage 2 sacral pressure injury, present on admission  WOC consulted   Mild rhabdomyolysis, resolved CK4 93, consistent with right thigh pain Plan: Resolved at time of discharge.   AKI, improved Increase in serum creatinine to ?1.5 times baseline, which is known or presumed to    have occurred within the prior seven days Suspect prerenal azotemia Kidney function improved Plan: Avoid nephrotoxins Avoid hypotension resolved at time of discharge     Leukocytosis Suspect reactive leukocytosis No clinical indication of infection   Tobacco abuse Counseling completed.   Discharge Instructions  Discharge Instructions     Diet - low sodium heart healthy   Complete by: As directed    Increase activity slowly   Complete by: As directed    No wound care   Complete by: As directed       Allergies as of 08/27/2021   Not on File      Medication List  STOP taking these medications    traMADol 50 MG tablet Commonly known as: ULTRAM       TAKE these medications    acetaminophen 500 MG tablet Commonly known as: TYLENOL Take 1,000 mg by mouth every 6 (six) hours as needed.   aspirin 81  MG chewable tablet Chew 81 mg by mouth daily.   bisacodyl 10 MG suppository Commonly known as: DULCOLAX Place 1 suppository (10 mg total) rectally daily as needed for mild constipation.   enoxaparin 40 MG/0.4ML injection Commonly known as: LOVENOX Inject 0.4 mLs (40 mg total) into the skin daily for 14 days.   oxyCODONE 5 MG immediate release tablet Commonly known as: Roxicodone Take 1 tablet (5 mg total) by mouth every 8 (eight) hours as needed for up to 3 days.   senna-docusate 8.6-50 MG tablet Commonly known as: Senokot-S Take 1 tablet by mouth daily.        Contact information for after-discharge care     Destination     [facility] .   Service: Skilled Nursing Contact information: [address] [place] [address] [phone]                    Not on File  Consultations: Orthopedic surgery   Procedures/Studies: DG Chest 1 View  Result Date: 08/21/2021 CLINICAL DATA:  Status post fall with subsequent right hip pain. EXAM: CHEST  1 VIEW COMPARISON:  None. FINDINGS: The heart size and mediastinal contours are within normal limits. Both lungs are clear. Degenerative changes seen involving the bilateral shoulders and thoracic spine. IMPRESSION: No active disease. Electronically Signed   By: Aram Candela M.D.   On: 08/21/2021 17:54   DG Pelvis 1-2 Views  Result Date: 08/21/2021 CLINICAL DATA:  Right-sided hip and thigh pain following fall EXAM: PELVIS - 1 VIEW; RIGHT FEMUR 2 VIEWS COMPARISON:  None. FINDINGS: Intertrochanteric fracture of the right femur, with rotation and impaction. No other fracture is seen in the pelvis or femur. Severe degenerative changes at the knee. Degenerative changes are also noted in the bilateral acetabula, sacroiliac joints, and lower lumbar spine. IMPRESSION: Intertrochanteric fracture of the right femur. Electronically Signed   By: Wiliam Ke M.D.   On: 08/21/2021 17:55   DG  C-Arm 1-60 Min-No Report  Result Date: 08/22/2021 Fluoroscopy was utilized by the requesting physician.  No radiographic interpretation.   DG C-Arm 1-60 Min-No Report  Result Date: 08/22/2021 Fluoroscopy was utilized by the requesting physician.  No radiographic interpretation.   DG HIP UNILAT WITH PELVIS 2-3 VIEWS RIGHT  Result Date: 08/22/2021 CLINICAL DATA:  Right hip intramedullary nail EXAM: DG HIP (WITH OR WITHOUT PELVIS) 2-3V RIGHT COMPARISON:  Pelvis radiographs obtained 1 day prior FINDINGS: Four C-arm fluoroscopic images were obtained intraoperatively and submitted for post operative interpretation. Images demonstrate interval intramedullary nail and screw fixation of the femur 4 fixation of a proximal femoral fracture. Hardware alignment appears within expected limits, without evidence of immediate complication. Degenerative changes are seen about the hip and knee. Please see the performing provider's procedural report for further detail. IMPRESSION: Interval intramedullary nail and screw fixation of the proximal right femoral fracture. Electronically Signed   By: Lesia Hausen M.D.   On: 08/22/2021 14:14   DG FEMUR, MIN 2 VIEWS RIGHT  Result Date: 08/22/2021 CLINICAL DATA:  Status post intramedullary rod fixation of right femur. EXAM: RIGHT FEMUR 2 VIEWS COMPARISON:  August 21, 2021. FINDINGS: Status post surgical internal fixation  of proximal right femoral intertrochanteric fracture as well as intramedullary rod fixation of the right femur. Good alignment of fracture components is noted. Postsurgical changes are seen in the surrounding soft tissues. IMPRESSION: Status post surgical internal fixation of proximal right femoral intertrochanteric fracture with intramedullary rod fixation of the right femur. Electronically Signed   By: Lupita Raider M.D.   On: 08/22/2021 15:24   DG Femur Min 2 Views Right  Result Date: 08/21/2021 CLINICAL DATA:  Right-sided hip and thigh pain  following fall EXAM: PELVIS - 1 VIEW; RIGHT FEMUR 2 VIEWS COMPARISON:  None. FINDINGS: Intertrochanteric fracture of the right femur, with rotation and impaction. No other fracture is seen in the pelvis or femur. Severe degenerative changes at the knee. Degenerative changes are also noted in the bilateral acetabula, sacroiliac joints, and lower lumbar spine. IMPRESSION: Intertrochanteric fracture of the right femur. Electronically Signed   By: Wiliam Ke M.D.   On: 08/21/2021 17:55      Subjective: Seen and examined on time of discharge.  Stable no distress.  Stable for discharge to skilled nursing facility  Discharge Exam: Vitals:   08/27/21 0345 08/27/21 0737  BP: 103/65 (!) 110/58  Pulse: 72 63  Resp: 20 16  Temp: 98.3 F (36.8 C) 98.3 F (36.8 C)  SpO2: 98% 98%   Vitals:   08/26/21 2008 08/26/21 2325 08/27/21 0345 08/27/21 0737  BP: 136/62 (!) 96/51 103/65 (!) 110/58  Pulse: 79 75 72 63  Resp: Temp: 98.9 F (37.2 C) 99 F (37.2 C) 98.3 F (36.8 C) 98.3 F (36.8 C)  TempSrc:      SpO2: 98% 95% 98% 98%  Weight:      Height:        General: Pt is alert, awake, not in acute distress Cardiovascular: RRR, S1/S2 +, no rubs, no gallops Respiratory: CTA bilaterally, no wheezing, no rhonchi Abdominal: Soft, NT, ND, bowel sounds + Extremities: no edema, no cyanosis   The results of significant diagnostics from this hospitalization (including imaging, microbiology, ancillary and laboratory) are listed below for reference.     Microbiology: Recent Results (from the past 240 hour(s))  Resp Panel by RT-PCR (Flu A&B, Covid) Nasopharyngeal Swab     Status: None   Collection Time: 08/21/21  5:16 PM   Specimen: Nasopharyngeal Swab; Nasopharyngeal(NP) swabs in vial transport medium  Result Value Ref Range Status   SARS Coronavirus 2 by RT PCR NEGATIVE NEGATIVE Final    Comment: (NOTE) SARS-CoV-2 target nucleic acids are NOT DETECTED.  The SARS-CoV-2 RNA is  generally detectable in upper respiratory specimens during the acute phase of infection. The lowest concentration of SARS-CoV-2 viral copies this assay can detect is 138 copies/mL. A negative result does not preclude SARS-Cov-2 infection and should not be used as the sole basis for treatment or other patient management decisions. A negative result may occur with  improper specimen collection/handling, submission of specimen other than nasopharyngeal swab, presence of viral mutation(s) within the areas targeted by this assay, and inadequate number of viral copies(<138 copies/mL). A negative result must be combined with clinical observations, patient history, and epidemiological information. The expected result is Negative.  Fact Sheet for Patients:  BloggerCourse.com  Fact Sheet for Healthcare Providers:  SeriousBroker.it  This test is no t yet approved or cleared by the [place] FDA and  has been authorized for detection and/or diagnosis of SARS-CoV-2 by FDA under an Emergency Use Authorization (EUA). This EUA will  remain  in effect (meaning this test can be used) for the duration of the COVID-19 declaration under Section 564(b)(1) of the Act, 21 U.S.C.section 360bbb-3(b)(1), unless the authorization is terminated  or revoked sooner.       Influenza A by PCR NEGATIVE NEGATIVE Final   Influenza B by PCR NEGATIVE NEGATIVE Final    Comment: (NOTE) The Xpert Xpress SARS-CoV-2/FLU/RSV plus assay is intended as an aid in the diagnosis of influenza from Nasopharyngeal swab specimens and should not be used as a sole basis for treatment. Nasal washings and aspirates are unacceptable for Xpert Xpress SARS-CoV-2/FLU/RSV testing.  Fact Sheet for Patients: BloggerCourse.com  Fact Sheet for Healthcare Providers: SeriousBroker.it  This test is not yet approved or cleared by the [place] FDA and has been authorized for detection and/or diagnosis of SARS-CoV-2 by FDA under an Emergency Use Authorization (EUA). This EUA will remain in effect (meaning this test can be used) for the duration of the COVID-19 declaration under Section 564(b)(1) of the Act, 21 U.S.C. section 360bbb-3(b)(1), unless the authorization is terminated or revoked.  Performed at [facility], [address] [place], [place] [address]   Resp Panel by RT-PCR (Flu A&B, Covid) Nasopharyngeal Swab     Status: None   Collection Time: 08/26/21 10:10 AM   Specimen: Nasopharyngeal Swab; Nasopharyngeal(NP) swabs in vial transport medium  Result Value Ref Range Status   SARS Coronavirus 2 by RT PCR NEGATIVE NEGATIVE Final    Comment: (NOTE) SARS-CoV-2 target nucleic acids are NOT DETECTED.  The SARS-CoV-2 RNA is generally detectable in upper respiratory specimens during the acute phase of infection. The lowest concentration of SARS-CoV-2 viral copies this assay can detect is 138 copies/mL. A negative result does not preclude SARS-Cov-2 infection and should not be used as the sole basis for treatment or other patient management decisions. A negative result may occur with  improper specimen collection/handling, submission of specimen other than nasopharyngeal swab, presence of viral mutation(s) within the areas targeted by this assay, and inadequate number of viral copies(<138 copies/mL). A negative result must be combined with clinical observations, patient history, and epidemiological information. The expected result is Negative.  Fact Sheet for Patients:  BloggerCourse.com  Fact Sheet for Healthcare Providers:  SeriousBroker.it  This test is no t yet approved or cleared by the [place] FDA and  has been authorized for detection and/or diagnosis of SARS-CoV-2 by FDA under an Emergency Use Authorization (EUA). This EUA will remain   in effect (meaning this test can be used) for the duration of the COVID-19 declaration under Section 564(b)(1) of the Act, 21 U.S.C.section 360bbb-3(b)(1), unless the authorization is terminated  or revoked sooner.       Influenza A by PCR NEGATIVE NEGATIVE Final   Influenza B by PCR NEGATIVE NEGATIVE Final    Comment: (NOTE) The Xpert Xpress SARS-CoV-2/FLU/RSV plus assay is intended as an aid in the diagnosis of influenza from Nasopharyngeal swab specimens and should not be used as a sole basis for treatment. Nasal washings and aspirates are unacceptable for Xpert Xpress SARS-CoV-2/FLU/RSV testing.  Fact Sheet for Patients: BloggerCourse.com  Fact Sheet for Healthcare Providers: SeriousBroker.it  This test is not yet approved or cleared by the [place] FDA and has been authorized for detection and/or diagnosis of SARS-CoV-2 by FDA under an Emergency Use Authorization (EUA). This EUA will remain in effect (meaning this test can be used) for the duration of the COVID-19 declaration under Section 564(b)(1) of the Act, 21  U.S.C. section 360bbb-3(b)(1), unless the authorization is terminated or revoked.  Performed at [facility], [address] [place], [place] [address]      Labs: BNP (last 3 results) No results for input(s): BNP in the last 8760 hours. Basic Metabolic Panel: Recent Labs  Lab 08/21/21 1716 08/22/21 0547 08/23/21 0506 08/24/21 0423  NA 138 138 135 134*  K 3.8 3.8 3.6 3.6  CL 108 110 108 108  CO2 22 21* 17* 21*  GLUCOSE 118* 106* 105* 112*  BUN 34* 39* 46* 44*  CREATININE 1.60* 1.51* 1.44* 1.22  CALCIUM 8.6* 7.9* 7.6* 7.9*  MG  --  2.2  --   --   PHOS  --  3.6  --   --    Liver Function Tests: Recent Labs  Lab 08/21/21 1716  AST 25  ALT 10  ALKPHOS 84  BILITOT 1.3*  PROT 7.6  ALBUMIN 3.9   No results for input(s): LIPASE, AMYLASE in the last 168 hours. No results for  input(s): AMMONIA in the last 168 hours. CBC: Recent Labs  Lab 08/21/21 1716 08/22/21 0547 08/23/21 0506 08/24/21 0423 08/24/21 1550 08/25/21 0548 08/25/21 1416 08/26/21 0251 08/26/21 1803  WBC 14.3* 11.2* 13.3* 8.8  --  10.5  --  8.0  --   NEUTROABS 11.8*  --   --   --   --   --   --  5.5  --   HGB 10.6* 8.7* 8.1* 7.3* 9.4* 8.1* 9.2* 7.7* 10.4*  HCT 31.7* 26.2* 24.0* 22.6* 28.1* 24.4*  --  23.2* 31.1*  MCV 97.8 96.7 95.6 97.0  --  94.2  --  95.5  --   PLT 206 176 192 162  --  191  --  194  --    Cardiac Enzymes: Recent Labs  Lab 08/21/21 1716 08/22/21 0547  CKTOTAL 493* 352   BNP: Invalid input(s): POCBNP CBG: No results for input(s): GLUCAP in the last 168 hours. D-Dimer No results for input(s): DDIMER in the last 72 hours. Hgb A1c No results for input(s): HGBA1C in the last 72 hours. Lipid Profile No results for input(s): CHOL, HDL, LDLCALC, TRIG, CHOLHDL, LDLDIRECT in the last 72 hours. Thyroid function studies No results for input(s): TSH, T4TOTAL, T3FREE, THYROIDAB in the last 72 hours.  Invalid input(s): FREET3 Anemia work up No results for input(s): VITAMINB12, FOLATE, FERRITIN, TIBC, IRON, RETICCTPCT in the last 72 hours. Urinalysis    Component Value Date/Time   COLORURINE YELLOW (A) 05/16/2020 2015   APPEARANCEUR CLEAR (A) 05/16/2020 2015   LABSPEC 1.015 05/16/2020 2015   PHURINE 5.0 05/16/2020 2015   GLUCOSEU NEGATIVE 05/16/2020 2015   HGBUR SMALL (A) 05/16/2020 2015   BILIRUBINUR NEGATIVE 05/16/2020 2015   KETONESUR NEGATIVE 05/16/2020 2015   PROTEINUR NEGATIVE 05/16/2020 2015   NITRITE NEGATIVE 05/16/2020 2015   LEUKOCYTESUR NEGATIVE 05/16/2020 2015   Sepsis Labs Invalid input(s): PROCALCITONIN,  WBC,  LACTICIDVEN Microbiology Recent Results (from the past 240 hour(s))  Resp Panel by RT-PCR (Flu A&B, Covid) Nasopharyngeal Swab     Status: None   Collection Time: 08/21/21  5:16 PM   Specimen: Nasopharyngeal Swab; Nasopharyngeal(NP) swabs in  vial transport medium  Result Value Ref Range Status   SARS Coronavirus 2 by RT PCR NEGATIVE NEGATIVE Final    Comment: (NOTE) SARS-CoV-2 target nucleic acids are NOT DETECTED.  The SARS-CoV-2 RNA is generally detectable in upper respiratory specimens during the acute phase of infection. The lowest concentration of SARS-CoV-2 viral  copies this assay can detect is 138 copies/mL. A negative result does not preclude SARS-Cov-2 infection and should not be used as the sole basis for treatment or other patient management decisions. A negative result may occur with  improper specimen collection/handling, submission of specimen other than nasopharyngeal swab, presence of viral mutation(s) within the areas targeted by this assay, and inadequate number of viral copies(<138 copies/mL). A negative result must be combined with clinical observations, patient history, and epidemiological information. The expected result is Negative.  Fact Sheet for Patients:  BloggerCourse.com  Fact Sheet for Healthcare Providers:  SeriousBroker.it  This test is no t yet approved or cleared by the [place] FDA and  has been authorized for detection and/or diagnosis of SARS-CoV-2 by FDA under an Emergency Use Authorization (EUA). This EUA will remain  in effect (meaning this test can be used) for the duration of the COVID-19 declaration under Section 564(b)(1) of the Act, 21 U.S.C.section 360bbb-3(b)(1), unless the authorization is terminated  or revoked sooner.       Influenza A by PCR NEGATIVE NEGATIVE Final   Influenza B by PCR NEGATIVE NEGATIVE Final    Comment: (NOTE) The Xpert Xpress SARS-CoV-2/FLU/RSV plus assay is intended as an aid in the diagnosis of influenza from Nasopharyngeal swab specimens and should not be used as a sole basis for treatment. Nasal washings and aspirates are unacceptable for Xpert Xpress SARS-CoV-2/FLU/RSV testing.  Fact  Sheet for Patients: BloggerCourse.com  Fact Sheet for Healthcare Providers: SeriousBroker.it  This test is not yet approved or cleared by the [place] FDA and has been authorized for detection and/or diagnosis of SARS-CoV-2 by FDA under an Emergency Use Authorization (EUA). This EUA will remain in effect (meaning this test can be used) for the duration of the COVID-19 declaration under Section 564(b)(1) of the Act, 21 U.S.C. section 360bbb-3(b)(1), unless the authorization is terminated or revoked.  Performed at [facility], [address] [place], [place] [address]   Resp Panel by RT-PCR (Flu A&B, Covid) Nasopharyngeal Swab     Status: None   Collection Time: 08/26/21 10:10 AM   Specimen: Nasopharyngeal Swab; Nasopharyngeal(NP) swabs in vial transport medium  Result Value Ref Range Status   SARS Coronavirus 2 by RT PCR NEGATIVE NEGATIVE Final    Comment: (NOTE) SARS-CoV-2 target nucleic acids are NOT DETECTED.  The SARS-CoV-2 RNA is generally detectable in upper respiratory specimens during the acute phase of infection. The lowest concentration of SARS-CoV-2 viral copies this assay can detect is 138 copies/mL. A negative result does not preclude SARS-Cov-2 infection and should not be used as the sole basis for treatment or other patient management decisions. A negative result may occur with  improper specimen collection/handling, submission of specimen other than nasopharyngeal swab, presence of viral mutation(s) within the areas targeted by this assay, and inadequate number of viral copies(<138 copies/mL). A negative result must be combined with clinical observations, patient history, and epidemiological information. The expected result is Negative.  Fact Sheet for Patients:  BloggerCourse.com  Fact Sheet for Healthcare Providers:  SeriousBroker.it  This  test is no t yet approved or cleared by the [place] FDA and  has been authorized for detection and/or diagnosis of SARS-CoV-2 by FDA under an Emergency Use Authorization (EUA). This EUA will remain  in effect (meaning this test can be used) for the duration of the COVID-19 declaration under Section 564(b)(1) of the Act, 21 U.S.C.section 360bbb-3(b)(1), unless the authorization is terminated  or revoked sooner.  Influenza A by PCR NEGATIVE NEGATIVE Final   Influenza B by PCR NEGATIVE NEGATIVE Final    Comment: (NOTE) The Xpert Xpress SARS-CoV-2/FLU/RSV plus assay is intended as an aid in the diagnosis of influenza from Nasopharyngeal swab specimens and should not be used as a sole basis for treatment. Nasal washings and aspirates are unacceptable for Xpert Xpress SARS-CoV-2/FLU/RSV testing.  Fact Sheet for Patients: BloggerCourse.com  Fact Sheet for Healthcare Providers: SeriousBroker.it  This test is not yet approved or cleared by the [place] FDA and has been authorized for detection and/or diagnosis of SARS-CoV-2 by FDA under an Emergency Use Authorization (EUA). This EUA will remain in effect (meaning this test can be used) for the duration of the COVID-19 declaration under Section 564(b)(1) of the Act, 21 U.S.C. section 360bbb-3(b)(1), unless the authorization is terminated or revoked.  Performed at [facility], [address], [place], [place] [address]      Time coordinating discharge: Over 30 minutes  SIGNED:   Tresa Moore, MD  [facility] 08/27/2021, 9:23 AM Pager   If 7PM-7AM, please contact night-coverage

## 2021-08-27 NOTE — TOC Progression Note (Signed)
Transition of Care [facility]) - Progression Note    Patient Details  Name: Keith Morris MRN: [id number] Date of Birth: 1932/01/11  [facility] CM/SW Contact  Caryn Section, RN Phone Number: 08/27/2021, 12:09 PM  Clinical Narrative:    Patient had notice for requisition of records from Adult Protective Services.  Contacted Ms. Lebron Conners at DSS, who was made aware that patient will be discharging to [facility].  Ms. Gabriel Rung will follow up with patient and family when transferred to SNF.    Expected Discharge Plan: Skilled Nursing Facility Barriers to Discharge: Continued Medical Work up  Expected Discharge Plan and Services Expected Discharge Plan: Skilled Nursing Facility   Discharge Planning Services: CM Consult Post Acute Care Choice: Skilled Nursing Facility Living arrangements for the past 2 months: Single Family Home Expected Discharge Date: 08/27/21                                     Social Determinants of Health (SDOH) Interventions    Readmission Risk Interventions No flowsheet data found.

## 2021-08-28 DIAGNOSIS — Z8781 Personal history of (healed) traumatic fracture: Secondary | ICD-10-CM | POA: Diagnosis not present

## 2021-08-28 DIAGNOSIS — L89159 Pressure ulcer of sacral region, unspecified stage: Secondary | ICD-10-CM | POA: Diagnosis not present

## 2021-08-28 DIAGNOSIS — Z87891 Personal history of nicotine dependence: Secondary | ICD-10-CM | POA: Diagnosis not present

## 2021-09-04 DIAGNOSIS — I70233 Atherosclerosis of native arteries of right leg with ulceration of ankle: Secondary | ICD-10-CM | POA: Diagnosis not present

## 2021-09-05 DIAGNOSIS — L97509 Non-pressure chronic ulcer of other part of unspecified foot with unspecified severity: Secondary | ICD-10-CM | POA: Diagnosis not present

## 2021-09-11 DIAGNOSIS — L89153 Pressure ulcer of sacral region, stage 3: Secondary | ICD-10-CM | POA: Diagnosis not present

## 2021-09-11 DIAGNOSIS — I70235 Atherosclerosis of native arteries of right leg with ulceration of other part of foot: Secondary | ICD-10-CM | POA: Diagnosis not present

## 2021-09-13 ENCOUNTER — Ambulatory Visit (INDEPENDENT_AMBULATORY_CARE_PROVIDER_SITE_OTHER): Payer: Medicare Other

## 2021-09-13 ENCOUNTER — Other Ambulatory Visit (INDEPENDENT_AMBULATORY_CARE_PROVIDER_SITE_OTHER): Payer: Self-pay | Admitting: Nurse Practitioner

## 2021-09-13 ENCOUNTER — Other Ambulatory Visit: Payer: Self-pay

## 2021-09-13 ENCOUNTER — Ambulatory Visit (INDEPENDENT_AMBULATORY_CARE_PROVIDER_SITE_OTHER): Payer: Medicare Other | Admitting: Nurse Practitioner

## 2021-09-13 VITALS — BP 174/72 | HR 59 | Ht 72.0 in | Wt 151.0 lb

## 2021-09-13 DIAGNOSIS — I739 Peripheral vascular disease, unspecified: Secondary | ICD-10-CM

## 2021-09-13 DIAGNOSIS — R1312 Dysphagia, oropharyngeal phase: Secondary | ICD-10-CM | POA: Diagnosis not present

## 2021-09-13 DIAGNOSIS — M25551 Pain in right hip: Secondary | ICD-10-CM | POA: Diagnosis not present

## 2021-09-13 DIAGNOSIS — Z720 Tobacco use: Secondary | ICD-10-CM

## 2021-09-13 DIAGNOSIS — M6281 Muscle weakness (generalized): Secondary | ICD-10-CM | POA: Diagnosis not present

## 2021-09-13 DIAGNOSIS — R2689 Other abnormalities of gait and mobility: Secondary | ICD-10-CM | POA: Diagnosis not present

## 2021-09-13 DIAGNOSIS — R488 Other symbolic dysfunctions: Secondary | ICD-10-CM | POA: Diagnosis not present

## 2021-09-13 DIAGNOSIS — I70299 Other atherosclerosis of native arteries of extremities, unspecified extremity: Secondary | ICD-10-CM

## 2021-09-13 DIAGNOSIS — L97909 Non-pressure chronic ulcer of unspecified part of unspecified lower leg with unspecified severity: Secondary | ICD-10-CM

## 2021-09-14 ENCOUNTER — Encounter (INDEPENDENT_AMBULATORY_CARE_PROVIDER_SITE_OTHER): Payer: Self-pay | Admitting: Nurse Practitioner

## 2021-09-14 DIAGNOSIS — M6281 Muscle weakness (generalized): Secondary | ICD-10-CM | POA: Diagnosis not present

## 2021-09-14 DIAGNOSIS — R1312 Dysphagia, oropharyngeal phase: Secondary | ICD-10-CM | POA: Diagnosis not present

## 2021-09-14 DIAGNOSIS — R2689 Other abnormalities of gait and mobility: Secondary | ICD-10-CM | POA: Diagnosis not present

## 2021-09-14 DIAGNOSIS — M25551 Pain in right hip: Secondary | ICD-10-CM | POA: Diagnosis not present

## 2021-09-14 DIAGNOSIS — R488 Other symbolic dysfunctions: Secondary | ICD-10-CM | POA: Diagnosis not present

## 2021-09-14 NOTE — H&P (View-Only) (Signed)
Subjective:    Patient ID: Keith Morris, male    DOB: 1932-05-28, 87 y.o.   MRN: [id number] Chief Complaint  Patient presents with   New Patient (Initial Visit)    NP abi and consult referred by [facility]    Lui Bellis is an 87 year old male that is sent by Dr. Gaynell Face in regards to concern for slow healing wounds.  The patient has a wound on his right ankle and left toe.  These wounds have been slow to heal.  The patient does not ambulate regularly so he denies claudication-like symptoms.  He denies rest pain however the patient is also a poor historian.  He does note that he has had a previous history of Charcot foot disease.  Today ABIs show right ABI of 1.04 and a left of 0.82.  The right TBI 0.37 on the left is 0.38.  The patient has monophasic tibial artery waveforms bilaterally with dampened toe waveforms on the right and nearly flat on the left.  The patient does have calcified plaque noted in the ABIs are unreliable.  Studies done at his nursing facility showed occluded SFA on the right as well as a small occlusion on the left.   Review of Systems  Musculoskeletal:  Positive for gait problem.  Skin:  Positive for wound.  Neurological:  Positive for weakness.  All other systems reviewed and are negative.     Objective:   Physical Exam Vitals reviewed.  HENT:     Head: Normocephalic.  Cardiovascular:     Rate and Rhythm: Normal rate.  Pulmonary:     Effort: Pulmonary effort is normal.  Skin:    General: Skin is warm and dry.     Findings: Wound present.  Neurological:     Mental Status: He is alert. Mental status is at baseline.     Motor: Weakness present.     Gait: Gait abnormal.  Psychiatric:        Mood and Affect: Mood normal.        Behavior: Behavior normal.    BP (!) 174/72    Pulse (!) 59    Ht 6' (1.829 m)    Wt 151 lb (68.5 kg)    BMI 20.48 kg/m   Past Medical History:  Diagnosis Date   Hypertension    Medical history non-contributory      Social History   Socioeconomic History   Marital status: Widowed    Spouse name: Not on file   Number of children: Not on file   Years of education: Not on file   Highest education level: Not on file  Occupational History   Not on file  Tobacco Use   Smoking status: Every Day    Packs/day: 0.50    Types: Cigarettes   Smokeless tobacco: Never  Vaping Use   Vaping Use: Never used  Substance and Sexual Activity   Alcohol use: Not Currently   Drug use: Never   Sexual activity: Not Currently  Other Topics Concern   Not on file  Social History Narrative   Patient lives alone. Was in rehab at [facility] last year.  Denies any medical issues.   Social Determinants of Health   Financial Resource Strain: Not on file  Food Insecurity: Not on file  Transportation Needs: Not on file  Physical Activity: Not on file  Stress: Not on file  Social Connections: Not on file  Intimate Partner Violence: Not on file    Past  Surgical History:  Procedure Laterality Date   EYE SURGERY     cataract   foot surgery bilaterally     d/t Charcot marie tooth   INTRAMEDULLARY (IM) NAIL INTERTROCHANTERIC Right 08/22/2021   Procedure: INTRAMEDULLARY (IM) NAIL INTERTROCHANTRIC;  Surgeon: Thornton Park, MD;  Location: [facility] ORS;  Service: Orthopedics;  Laterality: Right;    History reviewed. No pertinent family history.  Not on File  CBC Latest Ref Rng & Units 08/26/2021 08/26/2021 08/25/2021  WBC 4.0 - 10.5 K/uL - 8.0 -  Hemoglobin 13.0 - 17.0 g/dL 10.4(L) 7.7(L) 9.2(L)  Hematocrit 39.0 - 52.0 % 31.1(L) 23.2(L) -  Platelets 150 - 400 K/uL - 194 -      CMP     Component Value Date/Time   NA 134 (L) 08/24/2021 0423   K 3.6 08/24/2021 0423   CL 108 08/24/2021 0423   CO2 21 (L) 08/24/2021 0423   GLUCOSE 112 (H) 08/24/2021 0423   BUN 44 (H) 08/24/2021 0423   CREATININE 1.22 08/24/2021 0423   CALCIUM 7.9 (L) 08/24/2021 0423   PROT 7.6 08/21/2021 1716   ALBUMIN 3.9 08/21/2021  1716   AST 25 08/21/2021 1716   ALT 10 08/21/2021 1716   ALKPHOS 84 08/21/2021 1716   BILITOT 1.3 (H) 08/21/2021 1716   GFRNONAA 57 (L) 08/24/2021 0423   GFRAA >60 05/21/2020 0454     No results found.     Assessment & Plan:   1. Atherosclerosis of artery of extremity with ulceration ([place]) Based on the patient's bilateral occlusions as well as ulcerations, it is recommended that the patient undergo bilateral angiograms.  It is recommended that he undergo a right angiogram followed by left lower extremity angiogram.  However, there is concern that the patient is unable to consent.  We will reach out to the nursing facility to see if the patient has a designated power of attorney.  The patient also noted that he wanted to discuss with his family as well.  We will reach out to the facility and discuss recommended plan of care as well as consent.  If he does agree, we will reach out to schedule lower extremity angiograms.  2. Tobacco abuse Smoking cessation was discussed, 3-10 minutes spent on this topic specifically    Current Outpatient Medications on File Prior to Visit  Medication Sig Dispense Refill   acetaminophen (TYLENOL) 500 MG tablet Take 1,000 mg by mouth every 6 (six) hours as needed.     aspirin 81 MG chewable tablet Chew 81 mg by mouth daily.     bisacodyl (DULCOLAX) 10 MG suppository Place 1 suppository (10 mg total) rectally daily as needed for mild constipation. 12 suppository 0   senna-docusate (SENOKOT-S) 8.6-50 MG tablet Take 1 tablet by mouth daily.     enoxaparin (LOVENOX) 40 MG/0.4ML injection Inject 0.4 mLs (40 mg total) into the skin daily for 14 days. 5.6 mL 0   No current facility-administered medications on file prior to visit.    There are no Patient Instructions on file for this visit. No follow-ups on file.   Kris Hartmann, NP

## 2021-09-14 NOTE — Progress Notes (Signed)
Subjective:    Patient ID: Keith Morris, male    DOB: 05/04/1932, 87 y.o.   MRN: [id number] Chief Complaint  Patient presents with   New Patient (Initial Visit)    NP abi and consult referred by [facility]    Keith Morris is an 87 year old male that is sent by Dr. Ruthann Cancer in regards to concern for slow healing wounds.  The patient has a wound on his right ankle and left toe.  These wounds have been slow to heal.  The patient does not ambulate regularly so he denies claudication-like symptoms.  He denies rest pain however the patient is also a poor historian.  He does note that he has had a previous history of Charcot foot disease.  Today ABIs show right ABI of 1.04 and a left of 0.82.  The right TBI 0.37 on the left is 0.38.  The patient has monophasic tibial artery waveforms bilaterally with dampened toe waveforms on the right and nearly flat on the left.  The patient does have calcified plaque noted in the ABIs are unreliable.  Studies done at his nursing facility showed occluded SFA on the right as well as a small occlusion on the left.   Review of Systems  Musculoskeletal:  Positive for gait problem.  Skin:  Positive for wound.  Neurological:  Positive for weakness.  All other systems reviewed and are negative.     Objective:   Physical Exam Vitals reviewed.  HENT:     Head: Normocephalic.  Cardiovascular:     Rate and Rhythm: Normal rate.  Pulmonary:     Effort: Pulmonary effort is normal.  Skin:    General: Skin is warm and dry.     Findings: Wound present.  Neurological:     Mental Status: He is alert. Mental status is at baseline.     Motor: Weakness present.     Gait: Gait abnormal.  Psychiatric:        Mood and Affect: Mood normal.        Behavior: Behavior normal.    BP (!) 174/72   Pulse (!) 59   Ht 6' (1.829 m)   Wt 151 lb (68.5 kg)   BMI 20.48 kg/m   Past Medical History:  Diagnosis Date   Hypertension    Medical history non-contributory      Social History   Socioeconomic History   Marital status: Widowed    Spouse name: Not on file   Number of children: Not on file   Years of education: Not on file   Highest education level: Not on file  Occupational History   Not on file  Tobacco Use   Smoking status: Every Day    Packs/day: 0.50    Types: Cigarettes   Smokeless tobacco: Never  Vaping Use   Vaping Use: Never used  Substance and Sexual Activity   Alcohol use: Not Currently   Drug use: Never   Sexual activity: Not Currently  Other Topics Concern   Not on file  Social History Narrative   Patient lives alone. Was in rehab at [facility] last year.  Denies any medical issues.   Social Determinants of Health   Financial Resource Strain: Not on file  Food Insecurity: Not on file  Transportation Needs: Not on file  Physical Activity: Not on file  Stress: Not on file  Social Connections: Not on file  Intimate Partner Violence: Not on file    Past Surgical History:  Procedure  Laterality Date   EYE SURGERY     cataract   foot surgery bilaterally     d/t Charcot marie tooth   INTRAMEDULLARY (IM) NAIL INTERTROCHANTERIC Right 08/22/2021   Procedure: INTRAMEDULLARY (IM) NAIL INTERTROCHANTRIC;  Surgeon: Thornton Park, MD;  Location: [facility] ORS;  Service: Orthopedics;  Laterality: Right;    History reviewed. No pertinent family history.  Not on File  CBC Latest Ref Rng & Units 08/26/2021 08/26/2021 08/25/2021  WBC 4.0 - 10.5 K/uL - 8.0 -  Hemoglobin 13.0 - 17.0 g/dL 10.4(L) 7.7(L) 9.2(L)  Hematocrit 39.0 - 52.0 % 31.1(L) 23.2(L) -  Platelets 150 - 400 K/uL - 194 -      CMP     Component Value Date/Time   NA 134 (L) 08/24/2021 0423   K 3.6 08/24/2021 0423   CL 108 08/24/2021 0423   CO2 21 (L) 08/24/2021 0423   GLUCOSE 112 (H) 08/24/2021 0423   BUN 44 (H) 08/24/2021 0423   CREATININE 1.22 08/24/2021 0423   CALCIUM 7.9 (L) 08/24/2021 0423   PROT 7.6 08/21/2021 1716   ALBUMIN 3.9 08/21/2021  1716   AST 25 08/21/2021 1716   ALT 10 08/21/2021 1716   ALKPHOS 84 08/21/2021 1716   BILITOT 1.3 (H) 08/21/2021 1716   GFRNONAA 57 (L) 08/24/2021 0423   GFRAA >60 05/21/2020 0454     No results found.     Assessment & Plan:   1. Atherosclerosis of artery of extremity with ulceration ([place]) Based on the patient's bilateral occlusions as well as ulcerations, it is recommended that the patient undergo bilateral angiograms.  It is recommended that he undergo a right angiogram followed by left lower extremity angiogram.  However, there is concern that the patient is unable to consent.  We will reach out to the nursing facility to see if the patient has a designated power of attorney.  The patient also noted that he wanted to discuss with his family as well.  We will reach out to the facility and discuss recommended plan of care as well as consent.  If he does agree, we will reach out to schedule lower extremity angiograms.  2. Tobacco abuse Smoking cessation was discussed, 3-10 minutes spent on this topic specifically    Current Outpatient Medications on File Prior to Visit  Medication Sig Dispense Refill   acetaminophen (TYLENOL) 500 MG tablet Take 1,000 mg by mouth every 6 (six) hours as needed.     aspirin 81 MG chewable tablet Chew 81 mg by mouth daily.     bisacodyl (DULCOLAX) 10 MG suppository Place 1 suppository (10 mg total) rectally daily as needed for mild constipation. 12 suppository 0   senna-docusate (SENOKOT-S) 8.6-50 MG tablet Take 1 tablet by mouth daily.     enoxaparin (LOVENOX) 40 MG/0.4ML injection Inject 0.4 mLs (40 mg total) into the skin daily for 14 days. 5.6 mL 0   No current facility-administered medications on file prior to visit.    There are no Patient Instructions on file for this visit. No follow-ups on file.   Kris Hartmann, NP

## 2021-09-16 DIAGNOSIS — M25551 Pain in right hip: Secondary | ICD-10-CM | POA: Diagnosis not present

## 2021-09-16 DIAGNOSIS — R2689 Other abnormalities of gait and mobility: Secondary | ICD-10-CM | POA: Diagnosis not present

## 2021-09-16 DIAGNOSIS — R488 Other symbolic dysfunctions: Secondary | ICD-10-CM | POA: Diagnosis not present

## 2021-09-16 DIAGNOSIS — R1312 Dysphagia, oropharyngeal phase: Secondary | ICD-10-CM | POA: Diagnosis not present

## 2021-09-16 DIAGNOSIS — M6281 Muscle weakness (generalized): Secondary | ICD-10-CM | POA: Diagnosis not present

## 2021-09-17 DIAGNOSIS — R1312 Dysphagia, oropharyngeal phase: Secondary | ICD-10-CM | POA: Diagnosis not present

## 2021-09-17 DIAGNOSIS — R2689 Other abnormalities of gait and mobility: Secondary | ICD-10-CM | POA: Diagnosis not present

## 2021-09-17 DIAGNOSIS — M6281 Muscle weakness (generalized): Secondary | ICD-10-CM | POA: Diagnosis not present

## 2021-09-17 DIAGNOSIS — R488 Other symbolic dysfunctions: Secondary | ICD-10-CM | POA: Diagnosis not present

## 2021-09-17 DIAGNOSIS — M25551 Pain in right hip: Secondary | ICD-10-CM | POA: Diagnosis not present

## 2021-09-18 ENCOUNTER — Telehealth (INDEPENDENT_AMBULATORY_CARE_PROVIDER_SITE_OTHER): Payer: Self-pay

## 2021-09-18 DIAGNOSIS — R1312 Dysphagia, oropharyngeal phase: Secondary | ICD-10-CM | POA: Diagnosis not present

## 2021-09-18 DIAGNOSIS — M6281 Muscle weakness (generalized): Secondary | ICD-10-CM | POA: Diagnosis not present

## 2021-09-18 DIAGNOSIS — R2689 Other abnormalities of gait and mobility: Secondary | ICD-10-CM | POA: Diagnosis not present

## 2021-09-18 DIAGNOSIS — R488 Other symbolic dysfunctions: Secondary | ICD-10-CM | POA: Diagnosis not present

## 2021-09-18 DIAGNOSIS — L89622 Pressure ulcer of left heel, stage 2: Secondary | ICD-10-CM | POA: Diagnosis not present

## 2021-09-18 DIAGNOSIS — L89153 Pressure ulcer of sacral region, stage 3: Secondary | ICD-10-CM | POA: Diagnosis not present

## 2021-09-18 DIAGNOSIS — M25551 Pain in right hip: Secondary | ICD-10-CM | POA: Diagnosis not present

## 2021-09-18 NOTE — Telephone Encounter (Addendum)
I called [facility] to ask about the patient having a RLE and LLE angio with Dr. Wyn Quaker. The patient will talk with his children and then decide whether or not to have these procedures. Patient's son called and was in agreement of 09/26/21 for the right leg and 10/03/21 for the left leg. Patient will be scheduled pending the authorization.

## 2021-09-19 DIAGNOSIS — M25551 Pain in right hip: Secondary | ICD-10-CM | POA: Diagnosis not present

## 2021-09-19 DIAGNOSIS — R488 Other symbolic dysfunctions: Secondary | ICD-10-CM | POA: Diagnosis not present

## 2021-09-19 DIAGNOSIS — M6281 Muscle weakness (generalized): Secondary | ICD-10-CM | POA: Diagnosis not present

## 2021-09-19 DIAGNOSIS — R2689 Other abnormalities of gait and mobility: Secondary | ICD-10-CM | POA: Diagnosis not present

## 2021-09-19 DIAGNOSIS — R1312 Dysphagia, oropharyngeal phase: Secondary | ICD-10-CM | POA: Diagnosis not present

## 2021-09-20 DIAGNOSIS — R2689 Other abnormalities of gait and mobility: Secondary | ICD-10-CM | POA: Diagnosis not present

## 2021-09-20 DIAGNOSIS — M6281 Muscle weakness (generalized): Secondary | ICD-10-CM | POA: Diagnosis not present

## 2021-09-20 DIAGNOSIS — M25551 Pain in right hip: Secondary | ICD-10-CM | POA: Diagnosis not present

## 2021-09-20 DIAGNOSIS — R1312 Dysphagia, oropharyngeal phase: Secondary | ICD-10-CM | POA: Diagnosis not present

## 2021-09-20 DIAGNOSIS — R488 Other symbolic dysfunctions: Secondary | ICD-10-CM | POA: Diagnosis not present

## 2021-09-23 DIAGNOSIS — R1312 Dysphagia, oropharyngeal phase: Secondary | ICD-10-CM | POA: Diagnosis not present

## 2021-09-23 DIAGNOSIS — M6281 Muscle weakness (generalized): Secondary | ICD-10-CM | POA: Diagnosis not present

## 2021-09-23 DIAGNOSIS — R2689 Other abnormalities of gait and mobility: Secondary | ICD-10-CM | POA: Diagnosis not present

## 2021-09-23 DIAGNOSIS — M25551 Pain in right hip: Secondary | ICD-10-CM | POA: Diagnosis not present

## 2021-09-23 DIAGNOSIS — R488 Other symbolic dysfunctions: Secondary | ICD-10-CM | POA: Diagnosis not present

## 2021-09-24 DIAGNOSIS — R1312 Dysphagia, oropharyngeal phase: Secondary | ICD-10-CM | POA: Diagnosis not present

## 2021-09-24 DIAGNOSIS — R2689 Other abnormalities of gait and mobility: Secondary | ICD-10-CM | POA: Diagnosis not present

## 2021-09-24 DIAGNOSIS — R488 Other symbolic dysfunctions: Secondary | ICD-10-CM | POA: Diagnosis not present

## 2021-09-24 DIAGNOSIS — M6281 Muscle weakness (generalized): Secondary | ICD-10-CM | POA: Diagnosis not present

## 2021-09-24 DIAGNOSIS — M25551 Pain in right hip: Secondary | ICD-10-CM | POA: Diagnosis not present

## 2021-09-25 ENCOUNTER — Telehealth (INDEPENDENT_AMBULATORY_CARE_PROVIDER_SITE_OTHER): Payer: Self-pay

## 2021-09-25 DIAGNOSIS — M6281 Muscle weakness (generalized): Secondary | ICD-10-CM | POA: Diagnosis not present

## 2021-09-25 DIAGNOSIS — M25551 Pain in right hip: Secondary | ICD-10-CM | POA: Diagnosis not present

## 2021-09-25 DIAGNOSIS — R1312 Dysphagia, oropharyngeal phase: Secondary | ICD-10-CM | POA: Diagnosis not present

## 2021-09-25 DIAGNOSIS — I70233 Atherosclerosis of native arteries of right leg with ulceration of ankle: Secondary | ICD-10-CM | POA: Diagnosis not present

## 2021-09-25 DIAGNOSIS — L89153 Pressure ulcer of sacral region, stage 3: Secondary | ICD-10-CM | POA: Diagnosis not present

## 2021-09-25 DIAGNOSIS — R488 Other symbolic dysfunctions: Secondary | ICD-10-CM | POA: Diagnosis not present

## 2021-09-25 DIAGNOSIS — R2689 Other abnormalities of gait and mobility: Secondary | ICD-10-CM | POA: Diagnosis not present

## 2021-09-25 NOTE — Telephone Encounter (Signed)
Spoke with patient's son to make him aware that I have been unable to get a authorization and that it is still pending and has been expedited today. Patient will be scheduled after authorization. When I called on 09/20/21 I requested it be expedited then as well. [facility] has also been informed as I spoke with [place].

## 2021-09-26 ENCOUNTER — Encounter: Admission: RE | Payer: Self-pay | Source: Ambulatory Visit

## 2021-09-26 ENCOUNTER — Ambulatory Visit: Admission: RE | Admit: 2021-09-26 | Payer: Medicare Other | Source: Ambulatory Visit | Admitting: Vascular Surgery

## 2021-09-26 DIAGNOSIS — R2689 Other abnormalities of gait and mobility: Secondary | ICD-10-CM | POA: Diagnosis not present

## 2021-09-26 DIAGNOSIS — M6281 Muscle weakness (generalized): Secondary | ICD-10-CM | POA: Diagnosis not present

## 2021-09-26 DIAGNOSIS — R488 Other symbolic dysfunctions: Secondary | ICD-10-CM | POA: Diagnosis not present

## 2021-09-26 DIAGNOSIS — M25551 Pain in right hip: Secondary | ICD-10-CM | POA: Diagnosis not present

## 2021-09-26 DIAGNOSIS — R1312 Dysphagia, oropharyngeal phase: Secondary | ICD-10-CM | POA: Diagnosis not present

## 2021-09-26 SURGERY — LOWER EXTREMITY ANGIOGRAPHY
Anesthesia: Moderate Sedation | Site: Leg Lower | Laterality: Right

## 2021-09-27 DIAGNOSIS — M6281 Muscle weakness (generalized): Secondary | ICD-10-CM | POA: Diagnosis not present

## 2021-09-27 DIAGNOSIS — M25551 Pain in right hip: Secondary | ICD-10-CM | POA: Diagnosis not present

## 2021-09-27 DIAGNOSIS — R2689 Other abnormalities of gait and mobility: Secondary | ICD-10-CM | POA: Diagnosis not present

## 2021-09-27 DIAGNOSIS — R488 Other symbolic dysfunctions: Secondary | ICD-10-CM | POA: Diagnosis not present

## 2021-09-27 DIAGNOSIS — R1312 Dysphagia, oropharyngeal phase: Secondary | ICD-10-CM | POA: Diagnosis not present

## 2021-09-30 DIAGNOSIS — M6281 Muscle weakness (generalized): Secondary | ICD-10-CM | POA: Diagnosis not present

## 2021-09-30 DIAGNOSIS — R2689 Other abnormalities of gait and mobility: Secondary | ICD-10-CM | POA: Diagnosis not present

## 2021-09-30 DIAGNOSIS — R1312 Dysphagia, oropharyngeal phase: Secondary | ICD-10-CM | POA: Diagnosis not present

## 2021-09-30 DIAGNOSIS — R488 Other symbolic dysfunctions: Secondary | ICD-10-CM | POA: Diagnosis not present

## 2021-09-30 DIAGNOSIS — M25551 Pain in right hip: Secondary | ICD-10-CM | POA: Diagnosis not present

## 2021-10-01 DIAGNOSIS — R1312 Dysphagia, oropharyngeal phase: Secondary | ICD-10-CM | POA: Diagnosis not present

## 2021-10-01 DIAGNOSIS — R2689 Other abnormalities of gait and mobility: Secondary | ICD-10-CM | POA: Diagnosis not present

## 2021-10-01 DIAGNOSIS — R488 Other symbolic dysfunctions: Secondary | ICD-10-CM | POA: Diagnosis not present

## 2021-10-01 DIAGNOSIS — M6281 Muscle weakness (generalized): Secondary | ICD-10-CM | POA: Diagnosis not present

## 2021-10-01 DIAGNOSIS — M25551 Pain in right hip: Secondary | ICD-10-CM | POA: Diagnosis not present

## 2021-10-02 DIAGNOSIS — L89153 Pressure ulcer of sacral region, stage 3: Secondary | ICD-10-CM | POA: Diagnosis not present

## 2021-10-02 DIAGNOSIS — R1312 Dysphagia, oropharyngeal phase: Secondary | ICD-10-CM | POA: Diagnosis not present

## 2021-10-02 DIAGNOSIS — R488 Other symbolic dysfunctions: Secondary | ICD-10-CM | POA: Diagnosis not present

## 2021-10-02 DIAGNOSIS — I70233 Atherosclerosis of native arteries of right leg with ulceration of ankle: Secondary | ICD-10-CM | POA: Diagnosis not present

## 2021-10-02 DIAGNOSIS — M6281 Muscle weakness (generalized): Secondary | ICD-10-CM | POA: Diagnosis not present

## 2021-10-02 DIAGNOSIS — R2689 Other abnormalities of gait and mobility: Secondary | ICD-10-CM | POA: Diagnosis not present

## 2021-10-02 DIAGNOSIS — M25551 Pain in right hip: Secondary | ICD-10-CM | POA: Diagnosis not present

## 2021-10-02 NOTE — Telephone Encounter (Signed)
Spoke with the patient's son to let him know that the patient is scheduled on 10/03/21 for a RLE angio with Dr. Wyn Quaker with a 9:30 am arrival time to the MM. Patient is at [facility] and pre-procedure instructions will be faxed to [facility].

## 2021-10-03 ENCOUNTER — Telehealth (INDEPENDENT_AMBULATORY_CARE_PROVIDER_SITE_OTHER): Payer: Self-pay

## 2021-10-03 ENCOUNTER — Other Ambulatory Visit (INDEPENDENT_AMBULATORY_CARE_PROVIDER_SITE_OTHER): Payer: Self-pay | Admitting: Nurse Practitioner

## 2021-10-03 DIAGNOSIS — M6281 Muscle weakness (generalized): Secondary | ICD-10-CM | POA: Diagnosis not present

## 2021-10-03 DIAGNOSIS — M25551 Pain in right hip: Secondary | ICD-10-CM | POA: Diagnosis not present

## 2021-10-03 DIAGNOSIS — I739 Peripheral vascular disease, unspecified: Secondary | ICD-10-CM

## 2021-10-03 DIAGNOSIS — R1312 Dysphagia, oropharyngeal phase: Secondary | ICD-10-CM | POA: Diagnosis not present

## 2021-10-03 DIAGNOSIS — R2689 Other abnormalities of gait and mobility: Secondary | ICD-10-CM | POA: Diagnosis not present

## 2021-10-03 DIAGNOSIS — R488 Other symbolic dysfunctions: Secondary | ICD-10-CM | POA: Diagnosis not present

## 2021-10-03 MED ORDER — DIPHENHYDRAMINE HCL 50 MG/ML IJ SOLN: 50.0000 mg | Freq: Once | INTRAMUSCULAR | Status: DC | PRN

## 2021-10-03 MED ORDER — HYDROMORPHONE HCL 1 MG/ML IJ SOLN: 1.0000 mg | Freq: Once | INTRAMUSCULAR | Status: DC | PRN

## 2021-10-03 MED ORDER — SODIUM CHLORIDE 0.9 % IV SOLN: INTRAVENOUS | Status: DC

## 2021-10-03 MED ORDER — FAMOTIDINE 20 MG PO TABS: 40.0000 mg | ORAL_TABLET | Freq: Once | ORAL | Status: DC | PRN

## 2021-10-03 MED ORDER — CEFAZOLIN SODIUM-DEXTROSE 2-4 GM/100ML-% IV SOLN: 2.0000 g | Freq: Once | INTRAVENOUS | Status: DC

## 2021-10-03 MED ORDER — MIDAZOLAM HCL 2 MG/ML PO SYRP: 8.0000 mg | ORAL_SOLUTION | Freq: Once | ORAL | Status: DC | PRN

## 2021-10-03 MED ORDER — ONDANSETRON HCL 4 MG/2ML IJ SOLN: 4.0000 mg | Freq: Four times a day (QID) | INTRAMUSCULAR | Status: DC | PRN

## 2021-10-03 MED ORDER — METHYLPREDNISOLONE SODIUM SUCC 125 MG IJ SOLR: 125.0000 mg | Freq: Once | INTRAMUSCULAR | Status: DC | PRN

## 2021-10-03 NOTE — Telephone Encounter (Signed)
Keith Morris is calling in from the facility that Keith Morris is at and states that Keith Morris informed them today that he was to have an appointment today at 9. Explained he has an a procedure for an Angiogram, Keith Morris states she was unaware of all this and doesn't have any surgical procedures of where he needed to go and all the instructions. Keith Morris would like a call back so they can reschedule the procedure.

## 2021-10-03 NOTE — Telephone Encounter (Signed)
Patient has been rescheduled to 10/04/21 with a 11:00 am arrival time to the MM.

## 2021-10-04 ENCOUNTER — Ambulatory Visit
Admission: RE | Admit: 2021-10-04 | Discharge: 2021-10-04 | Disposition: A | Discharge status: Home / Self Care | Payer: Medicare Other | Source: Ambulatory Visit | Attending: Vascular Surgery | Admitting: Vascular Surgery

## 2021-10-04 ENCOUNTER — Encounter: Payer: Self-pay | Admitting: Registered Nurse

## 2021-10-04 ENCOUNTER — Other Ambulatory Visit: Payer: Self-pay

## 2021-10-04 ENCOUNTER — Encounter
Admission: RE | Disposition: A | Discharge status: Home / Self Care | Payer: Self-pay | Source: Ambulatory Visit | Attending: Vascular Surgery

## 2021-10-04 ENCOUNTER — Other Ambulatory Visit (INDEPENDENT_AMBULATORY_CARE_PROVIDER_SITE_OTHER): Payer: Self-pay | Admitting: Nurse Practitioner

## 2021-10-04 ENCOUNTER — Encounter: Payer: Self-pay | Admitting: Vascular Surgery

## 2021-10-04 DIAGNOSIS — I739 Peripheral vascular disease, unspecified: Secondary | ICD-10-CM

## 2021-10-04 DIAGNOSIS — R2689 Other abnormalities of gait and mobility: Secondary | ICD-10-CM | POA: Diagnosis not present

## 2021-10-04 DIAGNOSIS — M6281 Muscle weakness (generalized): Secondary | ICD-10-CM | POA: Diagnosis not present

## 2021-10-04 DIAGNOSIS — I70233 Atherosclerosis of native arteries of right leg with ulceration of ankle: Secondary | ICD-10-CM | POA: Diagnosis not present

## 2021-10-04 DIAGNOSIS — L97529 Non-pressure chronic ulcer of other part of left foot with unspecified severity: Secondary | ICD-10-CM | POA: Insufficient documentation

## 2021-10-04 DIAGNOSIS — L97909 Non-pressure chronic ulcer of unspecified part of unspecified lower leg with unspecified severity: Secondary | ICD-10-CM

## 2021-10-04 DIAGNOSIS — R488 Other symbolic dysfunctions: Secondary | ICD-10-CM | POA: Diagnosis not present

## 2021-10-04 DIAGNOSIS — I70245 Atherosclerosis of native arteries of left leg with ulceration of other part of foot: Secondary | ICD-10-CM | POA: Insufficient documentation

## 2021-10-04 DIAGNOSIS — I70249 Atherosclerosis of native arteries of left leg with ulceration of unspecified site: Secondary | ICD-10-CM | POA: Diagnosis not present

## 2021-10-04 DIAGNOSIS — I70239 Atherosclerosis of native arteries of right leg with ulceration of unspecified site: Secondary | ICD-10-CM | POA: Diagnosis not present

## 2021-10-04 DIAGNOSIS — I70213 Atherosclerosis of native arteries of extremities with intermittent claudication, bilateral legs: Secondary | ICD-10-CM | POA: Diagnosis not present

## 2021-10-04 DIAGNOSIS — L97319 Non-pressure chronic ulcer of right ankle with unspecified severity: Secondary | ICD-10-CM | POA: Insufficient documentation

## 2021-10-04 DIAGNOSIS — M25551 Pain in right hip: Secondary | ICD-10-CM | POA: Diagnosis not present

## 2021-10-04 DIAGNOSIS — F17210 Nicotine dependence, cigarettes, uncomplicated: Secondary | ICD-10-CM | POA: Diagnosis not present

## 2021-10-04 DIAGNOSIS — R1312 Dysphagia, oropharyngeal phase: Secondary | ICD-10-CM | POA: Diagnosis not present

## 2021-10-04 HISTORY — PX: LOWER EXTREMITY ANGIOGRAPHY: CATH118251

## 2021-10-04 LAB — CREATININE, SERUM
Creatinine, Ser: 1.01 mg/dL (ref 0.61–1.24)
GFR, Estimated: >60 mL/min (ref >60)

## 2021-10-04 LAB — BUN: BUN: 30 mg/dL — ABNORMAL HIGH (ref 8–23)

## 2021-10-04 SURGERY — LOWER EXTREMITY ANGIOGRAPHY
Anesthesia: Moderate Sedation | Site: Leg Lower | Laterality: Right

## 2021-10-04 MED ORDER — HEPARIN SODIUM (PORCINE) 1000 UNIT/ML IJ SOLN
INTRAMUSCULAR | Status: AC
  Filled 2021-10-04: qty 10

## 2021-10-04 MED ORDER — FENTANYL CITRATE (PF) 100 MCG/2ML IJ SOLN
INTRAMUSCULAR | Status: DC | PRN
  Administered 2021-10-04: 50 ug via INTRAVENOUS
  Administered 2021-10-04: 12.5 ug via INTRAVENOUS

## 2021-10-04 MED ORDER — MIDAZOLAM HCL 2 MG/ML PO SYRP: 8.0000 mg | ORAL_SOLUTION | Freq: Once | ORAL | Status: DC | PRN

## 2021-10-04 MED ORDER — FENTANYL CITRATE (PF) 100 MCG/2ML IJ SOLN
INTRAMUSCULAR | Status: AC
  Filled 2021-10-04: qty 2

## 2021-10-04 MED ORDER — SODIUM CHLORIDE 0.9 % IV SOLN: 250.0000 mL | INTRAVENOUS | Status: DC | PRN

## 2021-10-04 MED ORDER — HYDROMORPHONE HCL 1 MG/ML IJ SOLN: 1.0000 mg | Freq: Once | INTRAMUSCULAR | Status: DC | PRN

## 2021-10-04 MED ORDER — FAMOTIDINE 20 MG PO TABS: 40.0000 mg | ORAL_TABLET | Freq: Once | ORAL | Status: DC | PRN

## 2021-10-04 MED ORDER — ONDANSETRON HCL 4 MG/2ML IJ SOLN: 4.0000 mg | Freq: Four times a day (QID) | INTRAMUSCULAR | Status: DC | PRN

## 2021-10-04 MED ORDER — MIDAZOLAM HCL 2 MG/2ML IJ SOLN
INTRAMUSCULAR | Status: DC | PRN
  Administered 2021-10-04: 1 mg via INTRAVENOUS
  Administered 2021-10-04: .5 mg via INTRAVENOUS

## 2021-10-04 MED ORDER — CEFAZOLIN SODIUM-DEXTROSE 2-4 GM/100ML-% IV SOLN
2.0000 g | Freq: Once | INTRAVENOUS | Status: AC
  Administered 2021-10-04: 13:00:00 2 g via INTRAVENOUS

## 2021-10-04 MED ORDER — ALTEPLASE 1 MG/ML SYRINGE FOR VASCULAR PROCEDURE
INTRAMUSCULAR | Status: DC | PRN
  Administered 2021-10-04: 6 mg via INTRA_ARTERIAL

## 2021-10-04 MED ORDER — ATORVASTATIN CALCIUM 10 MG PO TABS: 10.0000 mg | ORAL_TABLET | Freq: Every day | ORAL | 11 refills | Status: AC

## 2021-10-04 MED ORDER — SODIUM CHLORIDE 0.9% FLUSH: 3.0000 mL | INTRAVENOUS | Status: DC | PRN

## 2021-10-04 MED ORDER — HYDRALAZINE HCL 20 MG/ML IJ SOLN: 5.0000 mg | INTRAMUSCULAR | Status: DC | PRN

## 2021-10-04 MED ORDER — SODIUM CHLORIDE 0.9 % IV SOLN: INTRAVENOUS | Status: DC

## 2021-10-04 MED ORDER — LABETALOL HCL 5 MG/ML IV SOLN: 10.0000 mg | INTRAVENOUS | Status: DC | PRN

## 2021-10-04 MED ORDER — SODIUM CHLORIDE 0.9% FLUSH: 3.0000 mL | Freq: Two times a day (BID) | INTRAVENOUS | Status: DC

## 2021-10-04 MED ORDER — MIDAZOLAM HCL 2 MG/2ML IJ SOLN
INTRAMUSCULAR | Status: AC
  Filled 2021-10-04: qty 2

## 2021-10-04 MED ORDER — DIPHENHYDRAMINE HCL 50 MG/ML IJ SOLN: 50.0000 mg | Freq: Once | INTRAMUSCULAR | Status: DC | PRN

## 2021-10-04 MED ORDER — METHYLPREDNISOLONE SODIUM SUCC 125 MG IJ SOLR: 125.0000 mg | Freq: Once | INTRAMUSCULAR | Status: DC | PRN

## 2021-10-04 MED ORDER — ATORVASTATIN CALCIUM 10 MG PO TABS: 10.0000 mg | ORAL_TABLET | Freq: Every day | ORAL | Status: DC

## 2021-10-04 MED ORDER — HEPARIN SODIUM (PORCINE) 1000 UNIT/ML IJ SOLN
INTRAMUSCULAR | Status: DC | PRN
  Administered 2021-10-04: 5000 [IU] via INTRAVENOUS

## 2021-10-04 MED ORDER — CLOPIDOGREL BISULFATE 75 MG PO TABS: 75.0000 mg | ORAL_TABLET | Freq: Every day | ORAL | Status: DC

## 2021-10-04 MED ORDER — CLOPIDOGREL BISULFATE 75 MG PO TABS: 75.0000 mg | ORAL_TABLET | Freq: Every day | ORAL | 11 refills | Status: AC

## 2021-10-04 MED ORDER — ACETAMINOPHEN 325 MG PO TABS: 650.0000 mg | ORAL_TABLET | ORAL | Status: DC | PRN

## 2021-10-04 SURGICAL SUPPLY — 29 items
BALLN DORADO 6X100X135 (BALLOONS) ×2
BALLN LUTONIX 018 5X220X130 (BALLOONS) ×2
BALLN LUTONIX 018 5X300X130 (BALLOONS) ×2
BALLN ULTRVRSE 3X300X150 (BALLOONS) ×1
BALLN ULTRVRSE 3X300X150 OTW (BALLOONS) ×1
BALLN ULTRVRSE 5X220X150 (BALLOONS) ×2
BALLOON DORADO 6X100X135 (BALLOONS) IMPLANT
BALLOON LUTONIX 018 5X220X130 (BALLOONS) IMPLANT
BALLOON LUTONIX 018 5X300X130 (BALLOONS) IMPLANT
BALLOON ULTRVRSE 3X300X150 OTW (BALLOONS) IMPLANT
BALLOON ULTRVRSE 5X220X150 (BALLOONS) IMPLANT
CATH ANGIO 5F PIGTAIL 65CM (CATHETERS) ×1 IMPLANT
CATH BEACON 5 .035 65 KMP TIP (CATHETERS) ×1 IMPLANT
CATH NAVICROSS ANGLED 135CM (MICROCATHETER) ×1 IMPLANT
CATH TEMPO 5F RIM 65CM (CATHETERS) ×1 IMPLANT
COVER PROBE U/S 5X48 (MISCELLANEOUS) ×1 IMPLANT
DEVICE STARCLOSE SE CLOSURE (Vascular Products) ×1 IMPLANT
GLIDEWIRE ADV .035X260CM (WIRE) ×2 IMPLANT
GUIDEWIRE PFTE-COATED .018X300 (WIRE) ×1 IMPLANT
KIT ENCORE 26 ADVANTAGE (KITS) ×1 IMPLANT
PACK ANGIOGRAPHY (CUSTOM PROCEDURE TRAY) ×2 IMPLANT
SHEATH ANL2 6FRX45 HC (SHEATH) ×1 IMPLANT
SHEATH PINNACLE 5F 10CM (SHEATH) ×1 IMPLANT
STENT VIABAHN 6X250X120 (Permanent Stent) ×2 IMPLANT
STENT VIABAHN 6X50X120 (Permanent Stent) ×1 IMPLANT
SYR MEDRAD MARK 7 150ML (SYRINGE) ×2 IMPLANT
TUBING CONTRAST HIGH PRESS 72 (TUBING) ×1 IMPLANT
WIRE G V18X300CM (WIRE) ×1 IMPLANT
WIRE GUIDERIGHT .035X150 (WIRE) ×1 IMPLANT

## 2021-10-04 NOTE — Op Note (Signed)
[facility]  Percutaneous Study/Intervention Procedural Note   Date of Surgery: 10/04/2021  Surgeon(s):Jaiana Sheffer    Assistants:none  Pre-operative Diagnosis: PAD with ulceration BLE  Post-operative diagnosis:  Same  Procedure(s) Performed:             1.  Ultrasound guidance for vascular access left femoral artery             2.  Catheter placement into right common femoral artery from left femoral approach             3.  Aortogram and selective right lower extremity angiogram             4.  Percutaneous transluminal angioplasty of right anterior tibial artery with 3 mm diameter angioplasty balloon             5.  Percutaneous transluminal angioplasty of the right tibioperoneal trunk and proximal posterior tibial artery with 3 mm diameter angioplasty balloon  6.  Viabahn stent placement x3 to the right SFA and popliteal arteries with a pair of 6 mm diameter by 25 cm length stents as well as a 6 mm diameter by 5 cm length stent             7.  StarClose closure device left femoral artery  EBL: 10 cc  Contrast: 102 cc  Fluoro Time: 22.6 minutes  Moderate Conscious Sedation Time: approximately 97 minutes using one-point mg of Versed and 62.5 mcg of Fentanyl              Indications:  Patient is a 87 y.o.male with nonhealing ulcerations bilaterally. The patient has noninvasive study showing her perfusion bilaterally. The patient is brought in for angiography for further evaluation and potential treatment.  Due to the limb threatening nature of the situation, angiogram was performed for attempted limb salvage. The patient is aware that if the procedure fails, amputation would be expected.  The patient also understands that even with successful revascularization, amputation may still be required due to the severity of the situation.  Risks and benefits are discussed and informed consent is obtained.   Procedure:  The patient was identified and appropriate  procedural time out was performed.  The patient was then placed supine on the table and prepped and draped in the usual sterile fashion. Moderate conscious sedation was administered during a face to face encounter with the patient throughout the procedure with my supervision of the RN administering medicines and monitoring the patient's vital signs, pulse oximetry, telemetry and mental status throughout from the start of the procedure until the patient was taken to the recovery room. Ultrasound was used to evaluate the left common femoral artery.  It was patent but heavily diseased.  A digital ultrasound image was acquired.  A Seldinger needle was used to access the left common femoral artery under direct ultrasound guidance and a permanent image was performed.  A 0.035 J wire was advanced without resistance and a 5Fr sheath was placed.  Pigtail catheter was placed into the aorta and an AP aortogram was performed. This demonstrated both renal arteries are heavily diseased with >60% stenosis. The aorta was mildly aneurysmal.  Both common iliac arteries had moderate stenosis in the 60% range. The left external iliac artery appeared fairly heavily diseased and calcific.  The right external iliac artery did not have significant stenosis.  I then crossed the aortic bifurcation and advanced to the right femoral head. Selective right lower extremity angiogram was then performed.  This demonstrated moderate disease of the distal common femoral artery with a heavily diseased profunda femoris artery with greater than 70% stenosis.  The SFA was occluded after a heavily diseased proximal segment and there was not reconstitution until a diseased popliteal artery but then there was further occlusion through the tibioperoneal trunk with reconstitution of the posterior tibial artery as the only runoff distally initially.  The vessels were densely calcified and heavily diseased throughout.  It was felt that it was in the patient's  best interest to proceed with intervention after these images to avoid a second procedure and a larger amount of contrast and fluoroscopy based off of the findings from the initial angiogram. The patient was systemically heparinized and a 6 [place] Ansell sheath was then placed over the [employer] wire. I then used a Kumpe catheter and the advantage wire to navigate into the SFA occlusion.  Tediously was able to cross this with the advantage wire and exchanged for a CXI catheter and eventually a 0.018 advantage wire.  Initially, this will go into the anterior tibial artery and I could get into the mid to distal anterior tibial artery.  It was patent proximally with large collaterals but occluded distally.  I then went ahead and performed balloon angioplasty of the anterior tibial artery down to the mid segment and then use the same along with 3 mm balloon to predilate the SFA and popliteal lesions.  After predilated in the area, I was able to get the [facility] cross cath and an 0.018 advantage wire down into the posterior tibial artery which was the best runoff distally.  This wire was parked in the foot.  I then used a 3 mm diameter by 30 cm length angioplasty balloon to treat the tibioperoneal trunk and proximal portion of the posterior tibial artery which was heavily diseased with near occlusive stenosis.  I then predilated the SFA and popliteal lesions with 5 mm balloons but there remained extensive residual disease throughout from the origin of the SFA down to the distal popliteal artery.  I then used a series of Viabahn stents with a 6 mm diameter by 25 cm length Viabahn stent started just above the origin of the anterior tibial artery, second 6 mm diameter by 25 cm length stent was taken up to the proximal superficial femoral artery, and the last stent was 6 mm diameter by 5 cm in length placed at the origin of the SFA.  There was slight overlap between all stents.  These were postdilated with a Lutonix  drug-coated balloon that was 5 mm throughout.  Completion imaging showed markedly improved flow with less than 20% residual stenosis in the posterior tibial artery, less than 30% residual stenosis in the tibioperoneal trunk, no significant percent residual stenosis in the stented SFA and popliteal arteries.  There was some spasm distally, but his flow is markedly improved.  At this point, having ballooned up into the common femoral artery I did not want to access the right side and potentially address the common iliac lesions in a kissing stent fashion.  He is also scheduled for left leg angiogram next week and we may treat the left leg before coming back for a third procedure to address his aortoiliac disease. I elected to terminate the procedure. The sheath was removed and StarClose closure device was deployed in the left femoral artery with excellent hemostatic result. The patient was taken to the recovery room in stable condition having tolerated the procedure well.  Findings:  Aortogram:  both renal arteries are heavily diseased with >60% stenosis. The aorta was mildly aneurysmal.  Both common iliac arteries had moderate stenosis in the 60% range. The left external iliac artery appeared fairly heavily diseased and calcific.  The right external iliac artery did not have significant stenosis.              Right lower Extremity:  This demonstrated moderate disease of the distal common femoral artery with a heavily diseased profunda femoris artery with greater than 70% stenosis.  The SFA was occluded after a heavily diseased proximal segment and there was not reconstitution until a diseased popliteal artery but then there was further occlusion through the tibioperoneal trunk with reconstitution of the posterior tibial artery as the only runoff distally initially.  The vessels were densely calcified and heavily diseased throughout.    Disposition: Patient was taken to the recovery room in stable  condition having tolerated the procedure well.  Complications: None  Festus Barren 10/04/2021 2:34 PM   This note was created with Dragon Medical transcription system. Any errors in dictation are purely unintentional.

## 2021-10-04 NOTE — Interval H&P Note (Signed)
History and Physical Interval Note:  10/04/2021 12:10 PM  Keith Morris  has presented today for surgery, with the diagnosis of RLE Angiography   ASO with ulceration   BARD Rep  cc: [name].  The various methods of treatment have been discussed with the patient and family. After consideration of risks, benefits and other options for treatment, the patient has consented to  Procedure(s): LOWER EXTREMITY ANGIOGRAPHY (Right) as a surgical intervention.  The patient's history has been reviewed, patient examined, no change in status, stable for surgery.  I have reviewed the patient's chart and labs.  Questions were answered to the patient's satisfaction.     Festus Barren

## 2021-10-07 DIAGNOSIS — M6281 Muscle weakness (generalized): Secondary | ICD-10-CM | POA: Diagnosis not present

## 2021-10-07 DIAGNOSIS — M25551 Pain in right hip: Secondary | ICD-10-CM | POA: Diagnosis not present

## 2021-10-07 DIAGNOSIS — R2689 Other abnormalities of gait and mobility: Secondary | ICD-10-CM | POA: Diagnosis not present

## 2021-10-08 ENCOUNTER — Encounter: Payer: Self-pay | Admitting: Vascular Surgery

## 2021-10-08 DIAGNOSIS — R2689 Other abnormalities of gait and mobility: Secondary | ICD-10-CM | POA: Diagnosis not present

## 2021-10-08 DIAGNOSIS — M25551 Pain in right hip: Secondary | ICD-10-CM | POA: Diagnosis not present

## 2021-10-08 DIAGNOSIS — M6281 Muscle weakness (generalized): Secondary | ICD-10-CM | POA: Diagnosis not present

## 2021-10-09 DIAGNOSIS — R2689 Other abnormalities of gait and mobility: Secondary | ICD-10-CM | POA: Diagnosis not present

## 2021-10-09 DIAGNOSIS — I70233 Atherosclerosis of native arteries of right leg with ulceration of ankle: Secondary | ICD-10-CM | POA: Diagnosis not present

## 2021-10-09 DIAGNOSIS — I70235 Atherosclerosis of native arteries of right leg with ulceration of other part of foot: Secondary | ICD-10-CM | POA: Diagnosis not present

## 2021-10-09 DIAGNOSIS — M25551 Pain in right hip: Secondary | ICD-10-CM | POA: Diagnosis not present

## 2021-10-09 DIAGNOSIS — M6281 Muscle weakness (generalized): Secondary | ICD-10-CM | POA: Diagnosis not present

## 2021-10-10 DIAGNOSIS — M25551 Pain in right hip: Secondary | ICD-10-CM | POA: Diagnosis not present

## 2021-10-10 DIAGNOSIS — M6281 Muscle weakness (generalized): Secondary | ICD-10-CM | POA: Diagnosis not present

## 2021-10-10 DIAGNOSIS — R2689 Other abnormalities of gait and mobility: Secondary | ICD-10-CM | POA: Diagnosis not present

## 2021-10-10 DIAGNOSIS — D649 Anemia, unspecified: Secondary | ICD-10-CM | POA: Diagnosis not present

## 2021-10-11 ENCOUNTER — Telehealth (INDEPENDENT_AMBULATORY_CARE_PROVIDER_SITE_OTHER): Payer: Self-pay

## 2021-10-11 DIAGNOSIS — Z87891 Personal history of nicotine dependence: Secondary | ICD-10-CM | POA: Diagnosis not present

## 2021-10-11 DIAGNOSIS — M25552 Pain in left hip: Secondary | ICD-10-CM | POA: Diagnosis not present

## 2021-10-11 DIAGNOSIS — R2689 Other abnormalities of gait and mobility: Secondary | ICD-10-CM | POA: Diagnosis not present

## 2021-10-11 DIAGNOSIS — M25551 Pain in right hip: Secondary | ICD-10-CM | POA: Diagnosis not present

## 2021-10-11 DIAGNOSIS — Z789 Other specified health status: Secondary | ICD-10-CM | POA: Diagnosis not present

## 2021-10-11 DIAGNOSIS — Z8781 Personal history of (healed) traumatic fracture: Secondary | ICD-10-CM | POA: Diagnosis not present

## 2021-10-11 DIAGNOSIS — M6281 Muscle weakness (generalized): Secondary | ICD-10-CM | POA: Diagnosis not present

## 2021-10-11 NOTE — Telephone Encounter (Signed)
Spoke with Marylene Land at [facility] where the patient resides and the patient is scheduled with Dr. Wyn Quaker for a LLE angio on 10/17/21 with a 7:30 am arrival time to the MM. Pre-procedure instructions where discussed and will be faxed.

## 2021-10-14 DIAGNOSIS — M6281 Muscle weakness (generalized): Secondary | ICD-10-CM | POA: Diagnosis not present

## 2021-10-14 DIAGNOSIS — R2689 Other abnormalities of gait and mobility: Secondary | ICD-10-CM | POA: Diagnosis not present

## 2021-10-14 DIAGNOSIS — M25551 Pain in right hip: Secondary | ICD-10-CM | POA: Diagnosis not present

## 2021-10-15 DIAGNOSIS — M6281 Muscle weakness (generalized): Secondary | ICD-10-CM | POA: Diagnosis not present

## 2021-10-15 DIAGNOSIS — R2689 Other abnormalities of gait and mobility: Secondary | ICD-10-CM | POA: Diagnosis not present

## 2021-10-15 DIAGNOSIS — M25551 Pain in right hip: Secondary | ICD-10-CM | POA: Diagnosis not present

## 2021-10-16 ENCOUNTER — Telehealth (INDEPENDENT_AMBULATORY_CARE_PROVIDER_SITE_OTHER): Payer: Self-pay

## 2021-10-16 DIAGNOSIS — I70233 Atherosclerosis of native arteries of right leg with ulceration of ankle: Secondary | ICD-10-CM | POA: Diagnosis not present

## 2021-10-16 DIAGNOSIS — M6281 Muscle weakness (generalized): Secondary | ICD-10-CM | POA: Diagnosis not present

## 2021-10-16 DIAGNOSIS — L89153 Pressure ulcer of sacral region, stage 3: Secondary | ICD-10-CM | POA: Diagnosis not present

## 2021-10-16 DIAGNOSIS — I70235 Atherosclerosis of native arteries of right leg with ulceration of other part of foot: Secondary | ICD-10-CM | POA: Diagnosis not present

## 2021-10-16 DIAGNOSIS — M25551 Pain in right hip: Secondary | ICD-10-CM | POA: Diagnosis not present

## 2021-10-16 DIAGNOSIS — R2689 Other abnormalities of gait and mobility: Secondary | ICD-10-CM | POA: Diagnosis not present

## 2021-10-16 NOTE — Telephone Encounter (Signed)
Phyllis from [facility] called stating the patient was exposed to Covid but tested negative. Keith Morris was wondering if he would still be able to do his procedure. I called to specials and spoke with Foye Clock and told that as long as the patient is not positive they can have the procedure. Keith Morris also stated the patient's Xarelto was not stopped for 3 days I let her know to discontinue today and tomorrow.

## 2021-10-17 ENCOUNTER — Ambulatory Visit: Admission: RE | Admit: 2021-10-17 | Payer: Medicare Other | Source: Ambulatory Visit | Admitting: Vascular Surgery

## 2021-10-17 ENCOUNTER — Encounter: Admission: RE | Payer: Self-pay | Source: Ambulatory Visit

## 2021-10-17 ENCOUNTER — Other Ambulatory Visit (INDEPENDENT_AMBULATORY_CARE_PROVIDER_SITE_OTHER): Payer: Self-pay | Admitting: Nurse Practitioner

## 2021-10-17 DIAGNOSIS — I739 Peripheral vascular disease, unspecified: Secondary | ICD-10-CM

## 2021-10-17 DIAGNOSIS — I70299 Other atherosclerosis of native arteries of extremities, unspecified extremity: Secondary | ICD-10-CM

## 2021-10-17 SURGERY — LOWER EXTREMITY ANGIOGRAPHY
Anesthesia: Moderate Sedation | Site: Leg Lower | Laterality: Left

## 2021-10-17 MED ORDER — METHYLPREDNISOLONE SODIUM SUCC 125 MG IJ SOLR: 125.0000 mg | Freq: Once | INTRAMUSCULAR | Status: DC | PRN

## 2021-10-17 MED ORDER — MIDAZOLAM HCL 2 MG/ML PO SYRP: 8.0000 mg | ORAL_SOLUTION | Freq: Once | ORAL | Status: DC | PRN

## 2021-10-17 MED ORDER — SODIUM CHLORIDE 0.9 % IV SOLN: INTRAVENOUS | Status: DC

## 2021-10-17 MED ORDER — DIPHENHYDRAMINE HCL 50 MG/ML IJ SOLN: 50.0000 mg | Freq: Once | INTRAMUSCULAR | Status: DC | PRN

## 2021-10-17 MED ORDER — CEFAZOLIN SODIUM-DEXTROSE 2-4 GM/100ML-% IV SOLN: 2.0000 g | Freq: Once | INTRAVENOUS | Status: DC

## 2021-10-17 MED ORDER — FAMOTIDINE 20 MG PO TABS: 40.0000 mg | ORAL_TABLET | Freq: Once | ORAL | Status: DC | PRN

## 2021-10-18 DIAGNOSIS — M6281 Muscle weakness (generalized): Secondary | ICD-10-CM | POA: Diagnosis not present

## 2021-10-18 DIAGNOSIS — R2689 Other abnormalities of gait and mobility: Secondary | ICD-10-CM | POA: Diagnosis not present

## 2021-10-18 DIAGNOSIS — M25551 Pain in right hip: Secondary | ICD-10-CM | POA: Diagnosis not present

## 2021-10-21 DIAGNOSIS — R2689 Other abnormalities of gait and mobility: Secondary | ICD-10-CM | POA: Diagnosis not present

## 2021-10-21 DIAGNOSIS — M6281 Muscle weakness (generalized): Secondary | ICD-10-CM | POA: Diagnosis not present

## 2021-10-21 DIAGNOSIS — M25551 Pain in right hip: Secondary | ICD-10-CM | POA: Diagnosis not present

## 2021-10-22 DIAGNOSIS — M6281 Muscle weakness (generalized): Secondary | ICD-10-CM | POA: Diagnosis not present

## 2021-10-22 DIAGNOSIS — M25551 Pain in right hip: Secondary | ICD-10-CM | POA: Diagnosis not present

## 2021-10-22 DIAGNOSIS — R2689 Other abnormalities of gait and mobility: Secondary | ICD-10-CM | POA: Diagnosis not present

## 2021-10-23 DIAGNOSIS — I70245 Atherosclerosis of native arteries of left leg with ulceration of other part of foot: Secondary | ICD-10-CM | POA: Diagnosis not present

## 2021-10-23 DIAGNOSIS — R2689 Other abnormalities of gait and mobility: Secondary | ICD-10-CM | POA: Diagnosis not present

## 2021-10-23 DIAGNOSIS — I70233 Atherosclerosis of native arteries of right leg with ulceration of ankle: Secondary | ICD-10-CM | POA: Diagnosis not present

## 2021-10-23 DIAGNOSIS — L89153 Pressure ulcer of sacral region, stage 3: Secondary | ICD-10-CM | POA: Diagnosis not present

## 2021-10-23 DIAGNOSIS — M6281 Muscle weakness (generalized): Secondary | ICD-10-CM | POA: Diagnosis not present

## 2021-10-23 DIAGNOSIS — M25551 Pain in right hip: Secondary | ICD-10-CM | POA: Diagnosis not present

## 2021-10-24 DIAGNOSIS — R2689 Other abnormalities of gait and mobility: Secondary | ICD-10-CM | POA: Diagnosis not present

## 2021-10-24 DIAGNOSIS — M25551 Pain in right hip: Secondary | ICD-10-CM | POA: Diagnosis not present

## 2021-10-24 DIAGNOSIS — M6281 Muscle weakness (generalized): Secondary | ICD-10-CM | POA: Diagnosis not present

## 2021-10-25 DIAGNOSIS — D62 Acute posthemorrhagic anemia: Secondary | ICD-10-CM | POA: Diagnosis not present

## 2021-10-25 DIAGNOSIS — M25551 Pain in right hip: Secondary | ICD-10-CM | POA: Diagnosis not present

## 2021-10-25 DIAGNOSIS — R2689 Other abnormalities of gait and mobility: Secondary | ICD-10-CM | POA: Diagnosis not present

## 2021-10-25 DIAGNOSIS — M6281 Muscle weakness (generalized): Secondary | ICD-10-CM | POA: Diagnosis not present

## 2021-10-28 DIAGNOSIS — R2689 Other abnormalities of gait and mobility: Secondary | ICD-10-CM | POA: Diagnosis not present

## 2021-10-28 DIAGNOSIS — M6281 Muscle weakness (generalized): Secondary | ICD-10-CM | POA: Diagnosis not present

## 2021-10-28 DIAGNOSIS — M25551 Pain in right hip: Secondary | ICD-10-CM | POA: Diagnosis not present

## 2021-10-29 DIAGNOSIS — M25551 Pain in right hip: Secondary | ICD-10-CM | POA: Diagnosis not present

## 2021-10-29 DIAGNOSIS — M6281 Muscle weakness (generalized): Secondary | ICD-10-CM | POA: Diagnosis not present

## 2021-10-29 DIAGNOSIS — R2689 Other abnormalities of gait and mobility: Secondary | ICD-10-CM | POA: Diagnosis not present

## 2021-10-30 ENCOUNTER — Telehealth (INDEPENDENT_AMBULATORY_CARE_PROVIDER_SITE_OTHER): Payer: Self-pay

## 2021-10-30 DIAGNOSIS — M6281 Muscle weakness (generalized): Secondary | ICD-10-CM | POA: Diagnosis not present

## 2021-10-30 DIAGNOSIS — L89153 Pressure ulcer of sacral region, stage 3: Secondary | ICD-10-CM | POA: Diagnosis not present

## 2021-10-30 DIAGNOSIS — M25551 Pain in right hip: Secondary | ICD-10-CM | POA: Diagnosis not present

## 2021-10-30 DIAGNOSIS — I70233 Atherosclerosis of native arteries of right leg with ulceration of ankle: Secondary | ICD-10-CM | POA: Diagnosis not present

## 2021-10-30 DIAGNOSIS — I70245 Atherosclerosis of native arteries of left leg with ulceration of other part of foot: Secondary | ICD-10-CM | POA: Diagnosis not present

## 2021-10-30 DIAGNOSIS — R2689 Other abnormalities of gait and mobility: Secondary | ICD-10-CM | POA: Diagnosis not present

## 2021-10-30 NOTE — Telephone Encounter (Signed)
Spoke with Keith Morris at [facility] and the patient is scheduled with Dr. Wyn Quaker for a LLE angio on 11/04/21 with a 11:30 am arrival time to the MM. Pre-procedure instructions were discussed and will be faxed to attention Keith Morris at [facility].

## 2021-10-31 DIAGNOSIS — M6281 Muscle weakness (generalized): Secondary | ICD-10-CM | POA: Diagnosis not present

## 2021-10-31 DIAGNOSIS — M25551 Pain in right hip: Secondary | ICD-10-CM | POA: Diagnosis not present

## 2021-10-31 DIAGNOSIS — R2689 Other abnormalities of gait and mobility: Secondary | ICD-10-CM | POA: Diagnosis not present

## 2021-11-01 DIAGNOSIS — R2689 Other abnormalities of gait and mobility: Secondary | ICD-10-CM | POA: Diagnosis not present

## 2021-11-01 DIAGNOSIS — M25551 Pain in right hip: Secondary | ICD-10-CM | POA: Diagnosis not present

## 2021-11-01 DIAGNOSIS — M6281 Muscle weakness (generalized): Secondary | ICD-10-CM | POA: Diagnosis not present

## 2021-11-04 ENCOUNTER — Other Ambulatory Visit (INDEPENDENT_AMBULATORY_CARE_PROVIDER_SITE_OTHER): Payer: Self-pay | Admitting: Nurse Practitioner

## 2021-11-04 ENCOUNTER — Encounter
Admission: RE | Disposition: A | Discharge status: Skilled Nursing Facility | Payer: Self-pay | Source: Home / Self Care | Attending: Vascular Surgery

## 2021-11-04 ENCOUNTER — Encounter: Payer: Self-pay | Admitting: Vascular Surgery

## 2021-11-04 ENCOUNTER — Ambulatory Visit
Admission: RE | Admit: 2021-11-04 | Discharge: 2021-11-04 | Disposition: A | Discharge status: Skilled Nursing Facility | Payer: Medicare Other | Attending: Vascular Surgery | Admitting: Vascular Surgery

## 2021-11-04 ENCOUNTER — Other Ambulatory Visit: Payer: Self-pay

## 2021-11-04 DIAGNOSIS — I70299 Other atherosclerosis of native arteries of extremities, unspecified extremity: Secondary | ICD-10-CM

## 2021-11-04 DIAGNOSIS — L97929 Non-pressure chronic ulcer of unspecified part of left lower leg with unspecified severity: Secondary | ICD-10-CM | POA: Insufficient documentation

## 2021-11-04 DIAGNOSIS — I70239 Atherosclerosis of native arteries of right leg with ulceration of unspecified site: Secondary | ICD-10-CM

## 2021-11-04 DIAGNOSIS — Z7401 Bed confinement status: Secondary | ICD-10-CM | POA: Diagnosis not present

## 2021-11-04 DIAGNOSIS — I10 Essential (primary) hypertension: Secondary | ICD-10-CM | POA: Diagnosis not present

## 2021-11-04 DIAGNOSIS — L97919 Non-pressure chronic ulcer of unspecified part of right lower leg with unspecified severity: Secondary | ICD-10-CM | POA: Diagnosis not present

## 2021-11-04 DIAGNOSIS — I70213 Atherosclerosis of native arteries of extremities with intermittent claudication, bilateral legs: Secondary | ICD-10-CM | POA: Insufficient documentation

## 2021-11-04 DIAGNOSIS — I739 Peripheral vascular disease, unspecified: Secondary | ICD-10-CM

## 2021-11-04 DIAGNOSIS — F17210 Nicotine dependence, cigarettes, uncomplicated: Secondary | ICD-10-CM | POA: Insufficient documentation

## 2021-11-04 DIAGNOSIS — L97909 Non-pressure chronic ulcer of unspecified part of unspecified lower leg with unspecified severity: Secondary | ICD-10-CM

## 2021-11-04 DIAGNOSIS — Z743 Need for continuous supervision: Secondary | ICD-10-CM | POA: Diagnosis not present

## 2021-11-04 DIAGNOSIS — I70249 Atherosclerosis of native arteries of left leg with ulceration of unspecified site: Secondary | ICD-10-CM | POA: Insufficient documentation

## 2021-11-04 DIAGNOSIS — I8291 Chronic embolism and thrombosis of unspecified vein: Secondary | ICD-10-CM | POA: Diagnosis not present

## 2021-11-04 DIAGNOSIS — R5381 Other malaise: Secondary | ICD-10-CM | POA: Diagnosis not present

## 2021-11-04 HISTORY — PX: LOWER EXTREMITY ANGIOGRAPHY: CATH118251

## 2021-11-04 LAB — CREATININE, SERUM
Creatinine, Ser: 1.01 mg/dL (ref 0.61–1.24)
GFR, Estimated: >60 mL/min (ref >60)

## 2021-11-04 LAB — BUN: BUN: 26 mg/dL — ABNORMAL HIGH (ref 8–23)

## 2021-11-04 SURGERY — LOWER EXTREMITY ANGIOGRAPHY
Anesthesia: Moderate Sedation | Site: Leg Lower | Laterality: Left

## 2021-11-04 MED ORDER — SODIUM CHLORIDE 0.9 % IV SOLN: 250.0000 mL | INTRAVENOUS | Status: DC | PRN

## 2021-11-04 MED ORDER — SODIUM CHLORIDE 0.9 % IV SOLN: INTRAVENOUS | Status: DC

## 2021-11-04 MED ORDER — LABETALOL HCL 5 MG/ML IV SOLN: 10.0000 mg | INTRAVENOUS | Status: DC | PRN

## 2021-11-04 MED ORDER — NITROGLYCERIN 1 MG/10 ML FOR IR/CATH LAB
INTRA_ARTERIAL | Status: DC | PRN
  Administered 2021-11-04: 300 ug via INTRA_ARTERIAL
  Administered 2021-11-04: 250 ug via INTRA_ARTERIAL

## 2021-11-04 MED ORDER — CEFAZOLIN SODIUM-DEXTROSE 2-4 GM/100ML-% IV SOLN
INTRAVENOUS | Status: AC
  Administered 2021-11-04: 2 g via INTRAVENOUS
  Filled 2021-11-04: qty 100

## 2021-11-04 MED ORDER — ONDANSETRON HCL 4 MG/2ML IJ SOLN: 4.0000 mg | Freq: Four times a day (QID) | INTRAMUSCULAR | Status: DC | PRN

## 2021-11-04 MED ORDER — ACETAMINOPHEN 325 MG PO TABS: 650.0000 mg | ORAL_TABLET | ORAL | Status: DC | PRN

## 2021-11-04 MED ORDER — HYDRALAZINE HCL 20 MG/ML IJ SOLN: 5.0000 mg | INTRAMUSCULAR | Status: DC | PRN

## 2021-11-04 MED ORDER — FAMOTIDINE 20 MG PO TABS: 40.0000 mg | ORAL_TABLET | Freq: Once | ORAL | Status: DC | PRN

## 2021-11-04 MED ORDER — SODIUM CHLORIDE 0.9 % IV SOLN: INTRAVENOUS | Status: AC

## 2021-11-04 MED ORDER — MIDAZOLAM HCL 2 MG/ML PO SYRP: 8.0000 mg | ORAL_SOLUTION | Freq: Once | ORAL | Status: DC | PRN

## 2021-11-04 MED ORDER — FENTANYL CITRATE PF 50 MCG/ML IJ SOSY
PREFILLED_SYRINGE | INTRAMUSCULAR | Status: AC
  Filled 2021-11-04: qty 1

## 2021-11-04 MED ORDER — METHYLPREDNISOLONE SODIUM SUCC 125 MG IJ SOLR: 125.0000 mg | Freq: Once | INTRAMUSCULAR | Status: DC | PRN

## 2021-11-04 MED ORDER — MIDAZOLAM HCL 5 MG/5ML IJ SOLN
INTRAMUSCULAR | Status: AC
  Filled 2021-11-04: qty 5

## 2021-11-04 MED ORDER — SODIUM CHLORIDE 0.9% FLUSH: 3.0000 mL | Freq: Two times a day (BID) | INTRAVENOUS | Status: DC

## 2021-11-04 MED ORDER — NITROGLYCERIN 1 MG/10 ML FOR IR/CATH LAB
INTRA_ARTERIAL | Status: AC
  Filled 2021-11-04: qty 10

## 2021-11-04 MED ORDER — SODIUM CHLORIDE 0.9% FLUSH: 3.0000 mL | INTRAVENOUS | Status: DC | PRN

## 2021-11-04 MED ORDER — HEPARIN SODIUM (PORCINE) 1000 UNIT/ML IJ SOLN
INTRAMUSCULAR | Status: AC
  Filled 2021-11-04: qty 10

## 2021-11-04 MED ORDER — FENTANYL CITRATE (PF) 100 MCG/2ML IJ SOLN
INTRAMUSCULAR | Status: DC | PRN
  Administered 2021-11-04: 50 ug via INTRAVENOUS
  Administered 2021-11-04: 25 ug via INTRAVENOUS

## 2021-11-04 MED ORDER — HEPARIN SODIUM (PORCINE) 1000 UNIT/ML IJ SOLN
INTRAMUSCULAR | Status: DC | PRN
  Administered 2021-11-04: 5000 [IU] via INTRAVENOUS

## 2021-11-04 MED ORDER — DIPHENHYDRAMINE HCL 50 MG/ML IJ SOLN: 50.0000 mg | Freq: Once | INTRAMUSCULAR | Status: DC | PRN

## 2021-11-04 MED ORDER — CEFAZOLIN SODIUM-DEXTROSE 2-4 GM/100ML-% IV SOLN: 2.0000 g | Freq: Once | INTRAVENOUS | Status: AC

## 2021-11-04 MED ORDER — MIDAZOLAM HCL 2 MG/2ML IJ SOLN
INTRAMUSCULAR | Status: DC | PRN
  Administered 2021-11-04 (×2): 1 mg via INTRAVENOUS

## 2021-11-04 MED ORDER — HYDROMORPHONE HCL 1 MG/ML IJ SOLN: 1.0000 mg | Freq: Once | INTRAMUSCULAR | Status: DC | PRN

## 2021-11-04 SURGICAL SUPPLY — 36 items
BALLN ARMADA 3.0X200X150 (BALLOONS) ×1
BALLN ARMADA 3X200X150 (BALLOONS) ×1
BALLN DORADO 5X200X135 (BALLOONS) ×2
BALLN LUTONIX 018 4X220X130 (BALLOONS) ×2
BALLN LUTONIX 018 5X100X130 (BALLOONS) ×2
BALLN LUTONIX 018 5X220X130 (BALLOONS) ×2
BALLN LUTONIX 018 5X300X130 (BALLOONS) ×2
BALLN ULTRVRSE 3X220X150 (BALLOONS) ×2
BALLOON ARMADA 3X200X150 (BALLOONS) IMPLANT
BALLOON DORADO 5X200X135 (BALLOONS) IMPLANT
BALLOON LUTONIX 018 4X220X130 (BALLOONS) IMPLANT
BALLOON LUTONIX 018 5X100X130 (BALLOONS) IMPLANT
BALLOON LUTONIX 018 5X220X130 (BALLOONS) IMPLANT
BALLOON LUTONIX 018 5X300X130 (BALLOONS) IMPLANT
BALLOON ULTRVRSE 3X220X150 (BALLOONS) IMPLANT
CATH ANGIO 5F PIGTAIL 65CM (CATHETERS) ×1 IMPLANT
CATH BEACON 5 .035 65 KMP TIP (CATHETERS) ×1 IMPLANT
CATH BEACON 5 .038 100 VERT TP (CATHETERS) ×1 IMPLANT
CATH NAVICROSS ANGLED 135CM (MICROCATHETER) ×1 IMPLANT
CATH TEMPO 5F RIM 65CM (CATHETERS) ×1 IMPLANT
COVER PROBE U/S 5X48 (MISCELLANEOUS) ×1 IMPLANT
DEVICE STARCLOSE SE CLOSURE (Vascular Products) ×1 IMPLANT
DEVICE TORQUE (MISCELLANEOUS) ×1 IMPLANT
GAUZE SPONGE 4X4 12PLY STRL (GAUZE/BANDAGES/DRESSINGS) ×3 IMPLANT
GLIDEWIRE ADV .035X260CM (WIRE) ×1 IMPLANT
GUIDEWIRE PFTE-COATED .018X300 (WIRE) ×1 IMPLANT
KIT ENCORE 26 ADVANTAGE (KITS) ×1 IMPLANT
PACK ANGIOGRAPHY (CUSTOM PROCEDURE TRAY) ×2 IMPLANT
SHEATH ANL2 6FRX45 HC (SHEATH) ×1 IMPLANT
SHEATH BRITE TIP 5FRX11 (SHEATH) ×1 IMPLANT
STENT VIABAHN 6X250X120 (Permanent Stent) ×2 IMPLANT
STENT VIABAHN 6X25X120 (Permanent Stent) ×2 IMPLANT
SYR MEDRAD MARK 7 150ML (SYRINGE) ×1 IMPLANT
TUBING CONTRAST HIGH PRESS 72 (TUBING) ×1 IMPLANT
WIRE G V18X300CM (WIRE) ×1 IMPLANT
WIRE GUIDERIGHT .035X150 (WIRE) ×1 IMPLANT

## 2021-11-04 NOTE — Progress Notes (Signed)
Called [facility] and spoke with Marcelino Duster, nurse for patient.  Provided update of procedure today, medications given during procedure, 4 stents placed and stent cards with post-procedure paperwork.  Answered questions.

## 2021-11-04 NOTE — Op Note (Signed)
[facility]  Percutaneous Study/Intervention Procedural Note   Date of Surgery: 11/04/2021  Surgeon(s):Seryna Marek    Assistants:none  Pre-operative Diagnosis: PAD with ulceration bilateral lower extremities  Post-operative diagnosis:  Same  Procedure(s) Performed:             1.  Ultrasound guidance for vascular access right femoral artery             2.  Catheter placement into left common femoral artery from right femoral approach             3.  Aortogram and selective left lower extremity angiogram             4.  Percutaneous transluminal angioplasty of left posterior tibial artery with 3 mm diameter angioplasty balloon as well as 4 mm diameter Lutonix drug-coated angioplasty balloon in the proximal segment and tibioperoneal trunk             5.  Percutaneous transluminal angioplasty of the entire left SFA and popliteal arteries with 5 mm diameter Lutonix drug-coated angioplasty balloons  6.  Stent placement to the proximal left tibioperoneal trunk and popliteal artery with 6 mm diameter by 25 cm length Viabahn stent  7.  Stent placement to the left SFA and popliteal arteries with total of 3 Viabahn stents.  The first was 6 mm in diameter by 25 cm in length and then we extended proximally with a pair of 6 mm diameter by 2.5 cm length Viabahn stents to the origin  8.  Percutaneous transluminal angioplasty of the left external iliac artery and proximal common femoral artery with 5 mm diameter by 10 cm length Lutonix drug-coated angioplasty balloon             9.  StarClose closure device right femoral artery  EBL: 10 cc  Contrast: 105 cc  Fluoro Time: 28.1 minutes  Moderate Conscious Sedation Time: approximately 94 minutes using 2 mg of Versed and 75 mcg of Fentanyl              Indications:  Patient is a 88 y.o.male with nonhealing ulcerations of both lower extremities and severe peripheral arterial disease.  He is already status post right lower extremity  revascularization.  The patient is brought in for angiography for further evaluation and potential treatment.  Due to the limb threatening nature of the situation, angiogram was performed for attempted limb salvage. The patient is aware that if the procedure fails, amputation would be expected.  The patient also understands that even with successful revascularization, amputation may still be required due to the severity of the situation.  Risks and benefits are discussed and informed consent is obtained.   Procedure:  The patient was identified and appropriate procedural time out was performed.  The patient was then placed supine on the table and prepped and draped in the usual sterile fashion. Moderate conscious sedation was administered during a face to face encounter with the patient throughout the procedure with my supervision of the RN administering medicines and monitoring the patient's vital signs, pulse oximetry, telemetry and mental status throughout from the start of the procedure until the patient was taken to the recovery room. Ultrasound was used to evaluate the right common femoral artery.  It was patent .  A digital ultrasound image was acquired.  A Seldinger needle was used to access the right common femoral artery under direct ultrasound guidance and a permanent image was performed.  A 0.035 J wire was advanced  without resistance and a 5Fr sheath was placed.  Pigtail catheter was placed into the aorta and an AP aortogram was performed. This demonstrated that both renal arteries are heavily diseased with >60% stenosis. The aorta was mildly aneurysmal.  Both common iliac arteries had moderate stenosis in the 60% range. The left external iliac artery appeared fairly heavily diseased and calcific.  The right external iliac artery did not have significant stenosis.  I then crossed the aortic bifurcation and advanced to the left femoral head. Selective left lower extremity angiogram was then performed.  This demonstrated flush occlusion of the left SFA which was completely occluded down through the popliteal artery.  There was only faint reconstitution of what appeared to be a peroneal and posterior tibial artery in the mid to distal segments. It was felt that it was in the patient's best interest to proceed with intervention after these images to avoid a second procedure and a larger amount of contrast and fluoroscopy based off of the findings from the initial angiogram. The patient was systemically heparinized and a 6 [place] Ansell sheath was then placed over the [employer] wire. I then used a Kumpe catheter and the advantage wire to get into the superficial femoral artery which was extremely tedious due to the flush proximal occlusion.  Once were able to get in there, we tediously crossed with a Ferd Hibbs cross catheter and the advantage wire getting down into the posterior tibial artery in the mid to distal segment confirming intraluminal flow.  We then placed a V 18 wire.  A 3 mm diameter by 22 cm length angioplasty balloon was then inflated from the distal posterior tibial artery up through the tibioperoneal trunk and popliteal artery.  This was then used to predilate the entirety of the SFA and popliteal arteries as well.  I then used 5 mm diameter by 30 cm length Lutonix drug-coated angioplasty balloon and a 5 mm diameter by 22 cm length Lutonix drug-coated angioplasty balloon to treat the entire left SFA and popliteal arteries and then a 4 mm diameter Lutonix drug-coated angioplasty balloon for the tibioperoneal trunk and most proximal posterior tibial artery as well as the distal popliteal artery.  Each inflation was anywhere from 8 to 12 atm for 1 minute.  Completion imaging showed the SFA and popliteal arteries to still have diffuse disease with multiple areas of occlusion tracking down to residual greater than 70% stenosis in the tibioperoneal trunk.  I then took a 6 mm diameter by 25 cm length  Viabahn stent from the tibioperoneal trunk up to Hunter's canal.  An additional 6 mm diameter by 25 cm Viabahn stent was then taken up to the proximal SFA.  These were postdilated 5 mm diameter high-pressure angioplasty balloons.  There was still residual disease in the proximal SFA that had been treated with angioplasty but had greater than 50% residual stenosis we had to extend a pair of 6 mm diameter by 2.5 cm length Viabahn stents up to the origin of the SFA.  These were postdilated with a 5 mm balloon well.  Completion imaging showed less than 20% residual stenosis throughout the SFA and popliteal arteries as well as the tibioperoneal trunk and posterior tibial artery.  There was spasm distally was treated with intra-arterial nitroglycerin.  For the distal external iliac artery lesion as well as the disease the tract of the left common femoral artery, a 5 mm diameter by 10 cm length Lutonix drug-coated angioplasty balloon was inflated to 10 atm  for 1 minute in the left external iliac artery and proximal common femoral artery.  Completion imaging showed about a 30% residual stenosis after angioplasty. I elected to terminate the procedure. The sheath was removed and StarClose closure device was deployed in the right femoral artery with excellent hemostatic result. The patient was taken to the recovery room in stable condition having tolerated the procedure well.  Findings:               Aortogram:  both renal arteries are heavily diseased with >60% stenosis. The aorta was mildly aneurysmal.  Both common iliac arteries had moderate stenosis in the 60% range. The left external iliac artery appeared fairly heavily diseased and calcific.  The right external iliac artery did not have significant stenosis.              Left Lower Extremity:  This demonstrated flush occlusion of the left SFA which was completely occluded down through the popliteal artery.  There was only faint reconstitution of what appeared to be a  peroneal and posterior tibial artery in the mid to distal segments.   Disposition: Patient was taken to the recovery room in stable condition having tolerated the procedure well.  Complications: None  [facility] 11/04/2021 3:26 PM   This note was created with Dragon Medical transcription system. Any errors in dictation are purely unintentional.

## 2021-11-04 NOTE — H&P (Signed)
[facility] Admission History & Physical  MRN : [id number]  [facility] is a 88 y.o. (Nov 21, 1931) male who presents with chief complaint of No chief complaint on file. Marland Kitchen  History of Present Illness: Patient presents today for left lower extremity revascularization.  Had right leg intervention about a month ago.  Ulcerations bilaterally  No current facility-administered medications for this encounter.    Past Medical History:  Diagnosis Date   Hypertension    Medical history non-contributory     Past Surgical History:  Procedure Laterality Date   EYE SURGERY     cataract   foot surgery bilaterally     d/t Charcot marie tooth   INTRAMEDULLARY (IM) NAIL INTERTROCHANTERIC Right 08/22/2021   Procedure: INTRAMEDULLARY (IM) NAIL INTERTROCHANTRIC;  Surgeon: Juanell Fairly, MD;  Location: [facility] ORS;  Service: Orthopedics;  Laterality: Right;   LOWER EXTREMITY ANGIOGRAPHY Right 10/04/2021   Procedure: LOWER EXTREMITY ANGIOGRAPHY;  Surgeon: Annice Needy, MD;  Location: [facility] INVASIVE CV LAB;  Service: Cardiovascular;  Laterality: Right;     Social History   Tobacco Use   Smoking status: Every Day    Packs/day: 0.50    Types: Cigarettes   Smokeless tobacco: Never  Vaping Use   Vaping Use: Never used  Substance Use Topics   Alcohol use: Not Currently   Drug use: Never     No family history on file.  No Known Allergies   REVIEW OF SYSTEMS (Negative unless checked)  Constitutional: [] Weight loss  [] Fever  [] Chills Cardiac: [] Chest pain   [] Chest pressure   [] Palpitations   [] Shortness of breath when laying flat   [] Shortness of breath at rest   [] Shortness of breath with exertion. Vascular:  [] Pain in legs with walking   [] Pain in legs at rest   [] Pain in legs when laying flat   [] Claudication   [] Pain in feet when walking  [] Pain in feet at rest  [] Pain in feet when laying flat   [] History of DVT   [] Phlebitis   [] Swelling in legs   [] Varicose veins    [x] Non-healing ulcers Pulmonary:   [] Uses home oxygen   [] Productive cough   [] Hemoptysis   [] Wheeze  [] COPD   [] Asthma Neurologic:  [x] Dizziness  [] Blackouts   [] Seizures   [] History of stroke   [] History of TIA  [] Aphasia   [] Temporary blindness   [] Dysphagia   [] Weakness or numbness in arms   [] Weakness or numbness in legs Musculoskeletal:  [x] Arthritis   [] Joint swelling   [x] Joint pain   [] Low back pain Hematologic:  [] Easy bruising  [] Easy bleeding   [] Hypercoagulable state   [] Anemic  [] Hepatitis Gastrointestinal:  [] Blood in stool   [] Vomiting blood  [] Gastroesophageal reflux/heartburn   [] Difficulty swallowing. Genitourinary:  [] Chronic kidney disease   [] Difficult urination  [] Frequent urination  [] Burning with urination   [] Blood in urine Skin:  [] Rashes   [x] Ulcers   [x] Wounds Psychological:  [] History of anxiety   []  History of major depression.  Physical Examination  There were no vitals filed for this visit. There is no height or weight on file to calculate BMI. Gen: WD/WN, debilitated and elderly Head: /AT, No temporalis wasting.  Ear/Nose/Throat: Hearing grossly intact, nares w/o erythema or drainage, oropharynx w/o Erythema/Exudate,  Eyes: Conjunctiva clear, sclera non-icteric Neck: Trachea midline.  No JVD.  Pulmonary:  Good air movement, respirations not labored, no use of accessory muscles.  Cardiac: Irregular Vascular:  Vessel Right Left  Radial Palpable Palpable  PT 1+ palpable Not palpable  DP 1+ palpable Not palpable   Gastrointestinal: soft, non-tender/non-distended. No guarding/reflex.  Musculoskeletal: M/S 5/5 throughout.  Bilateral lower extremity wounds dressed.  Mild lower extremity edema Neurologic: Sensation grossly intact in extremities.  Symmetrical.  Speech is fluent. Motor exam as listed above. Psychiatric: Judgment intact, Mood & affect appropriate for pt's clinical situation. Dermatologic: Bilateral lower  extremity wounds dressed     CBC Lab Results  Component Value Date   WBC 8.0 08/26/2021   HGB 10.4 (L) 08/26/2021   HCT 31.1 (L) 08/26/2021   MCV 95.5 08/26/2021   PLT 194 08/26/2021    BMET    Component Value Date/Time   NA 134 (L) 08/24/2021 0423   K 3.6 08/24/2021 0423   CL 108 08/24/2021 0423   CO2 21 (L) 08/24/2021 0423   GLUCOSE 112 (H) 08/24/2021 0423   BUN 30 (H) 10/04/2021 1122   CREATININE 1.01 10/04/2021 1122   CALCIUM 7.9 (L) 08/24/2021 0423   GFRNONAA >60 10/04/2021 1122   GFRAA >60 05/21/2020 0454   CrCl cannot be calculated (Patient's most recent lab result is older than the maximum 21 days allowed.).  COAG No results found for: INR, PROTIME  Radiology No results found.   Assessment/Plan 1.  PAD with ulceration bilateral lower extremities.  Underwent right leg revascularization a few weeks ago.  Now for left lower extremity revascularization.  Risks and benefits discussed. 2.  Hypertension.  Stable on outpatient medications and blood pressure control important in reducing the progression of atherosclerotic disease. On appropriate oral medications.     Festus Barren, MD  11/04/2021 11:38 AM

## 2021-11-05 ENCOUNTER — Encounter: Payer: Self-pay | Admitting: Vascular Surgery

## 2021-11-06 DIAGNOSIS — I70233 Atherosclerosis of native arteries of right leg with ulceration of ankle: Secondary | ICD-10-CM | POA: Diagnosis not present

## 2021-11-06 DIAGNOSIS — I70235 Atherosclerosis of native arteries of right leg with ulceration of other part of foot: Secondary | ICD-10-CM | POA: Diagnosis not present

## 2021-11-06 DIAGNOSIS — L89153 Pressure ulcer of sacral region, stage 3: Secondary | ICD-10-CM | POA: Diagnosis not present

## 2021-12-04 DEATH — deceased

## 2022-07-11 IMAGING — XA DG HIP (WITH OR WITHOUT PELVIS) 2-3V*R*
1 series · 4 of 4 positions shown · non-contrast
Comparison: Pelvis radiographs obtained 1 day prior

CLINICAL DATA: Right hip intramedullary nail

EXAM:
DG HIP (WITH OR WITHOUT PELVIS) 2-3V RIGHT

[Series 1: dg x-ray · 0.20mm/px · 4 of 4 slices shown]
[im 1/4]
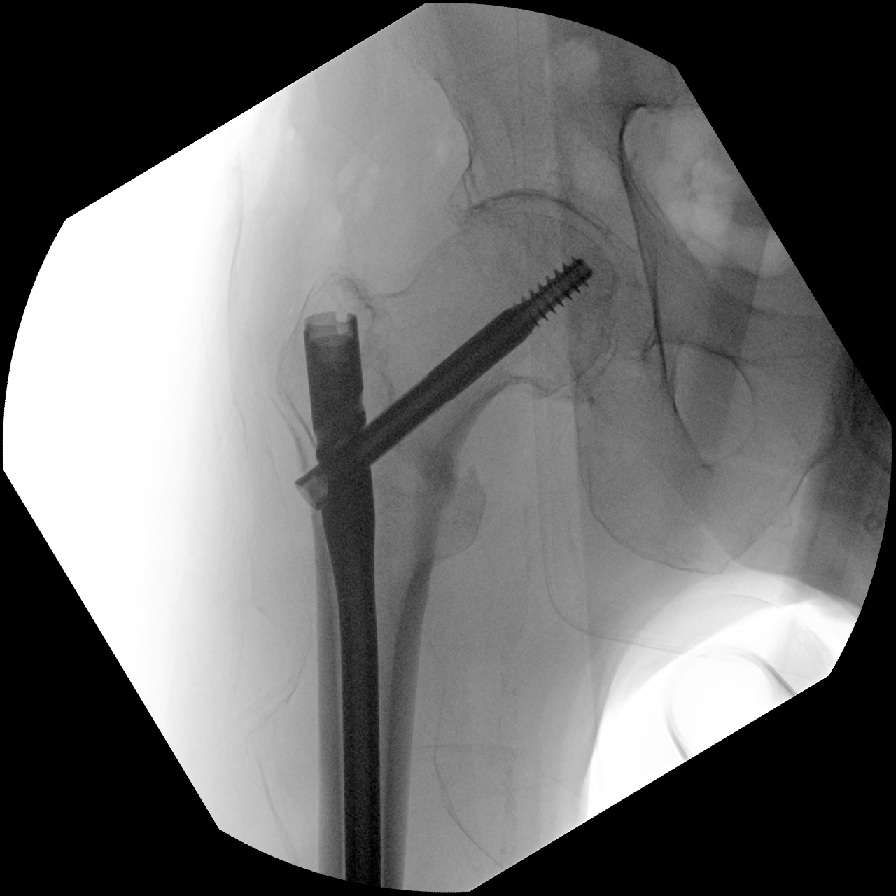
[im 2/4]
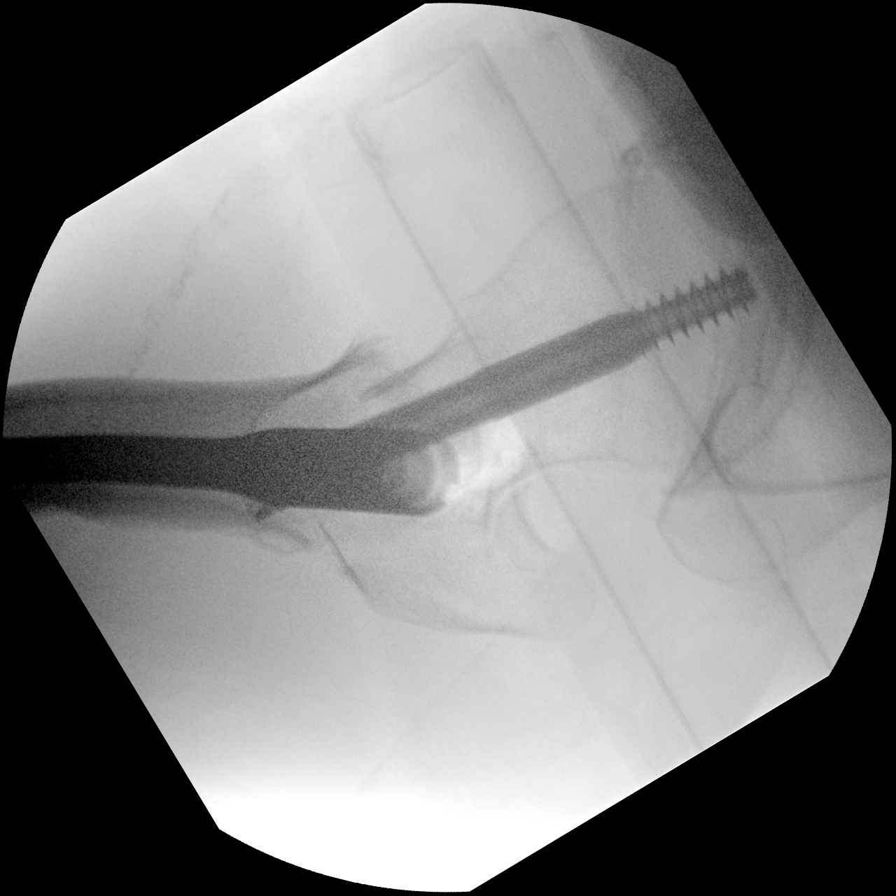
[im 3/4]
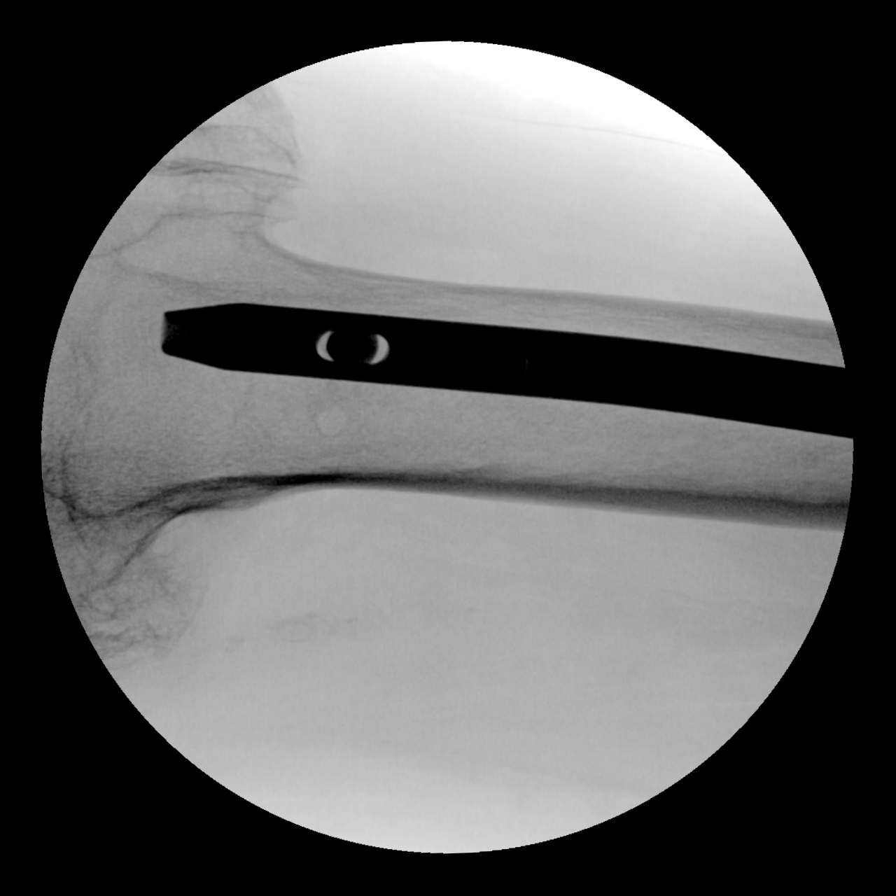
[im 4/4]
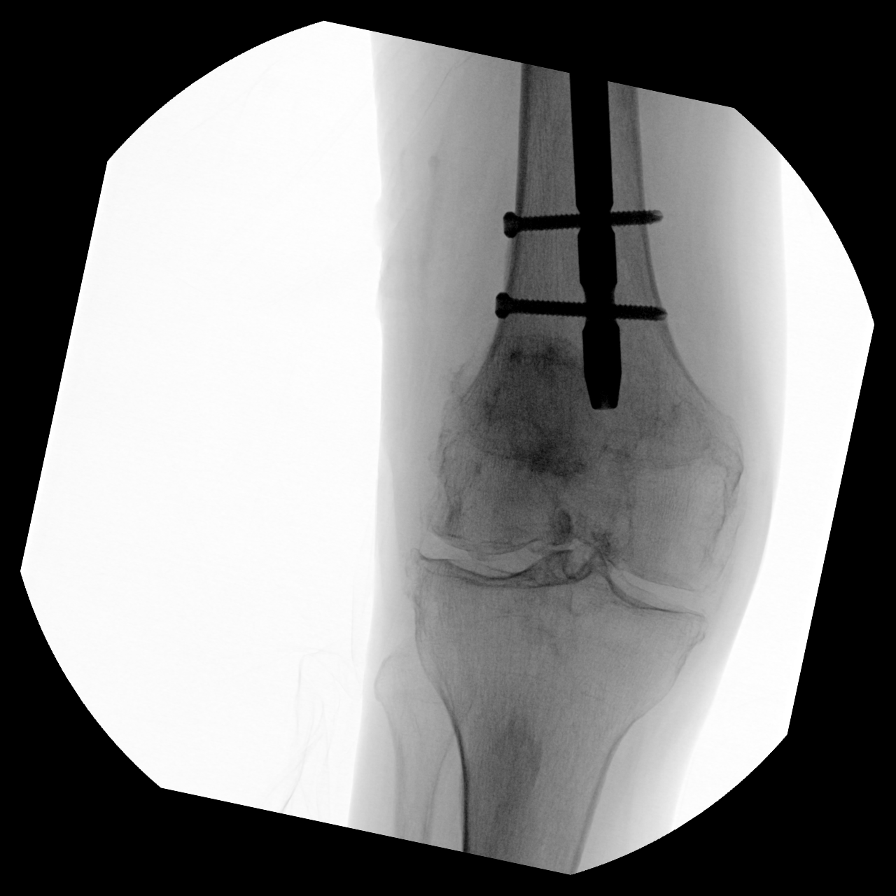

[4 of 4 positions shown; findings below may reference images not displayed]

FINDINGS: Four C-arm fluoroscopic images were obtained intraoperatively and
submitted for post operative interpretation. Images demonstrate
interval intramedullary nail and screw fixation of the femur 4
fixation of a proximal femoral fracture. Hardware alignment appears
within expected limits, without evidence of immediate complication.
Degenerative changes are seen about the hip and knee. Please see the
performing provider's procedural report for further detail.
IMPRESSION: Interval intramedullary nail and screw fixation of the proximal
right femoral fracture.

## 2022-07-11 IMAGING — DX DG FEMUR 2+V*R*
4 series · 4 of 4 positions shown · non-contrast
Comparison: August 21, 2021.

CLINICAL DATA: Status post intramedullary rod fixation of right
femur.

EXAM:
RIGHT FEMUR 2 VIEWS

[femur ap (1 of 2)]
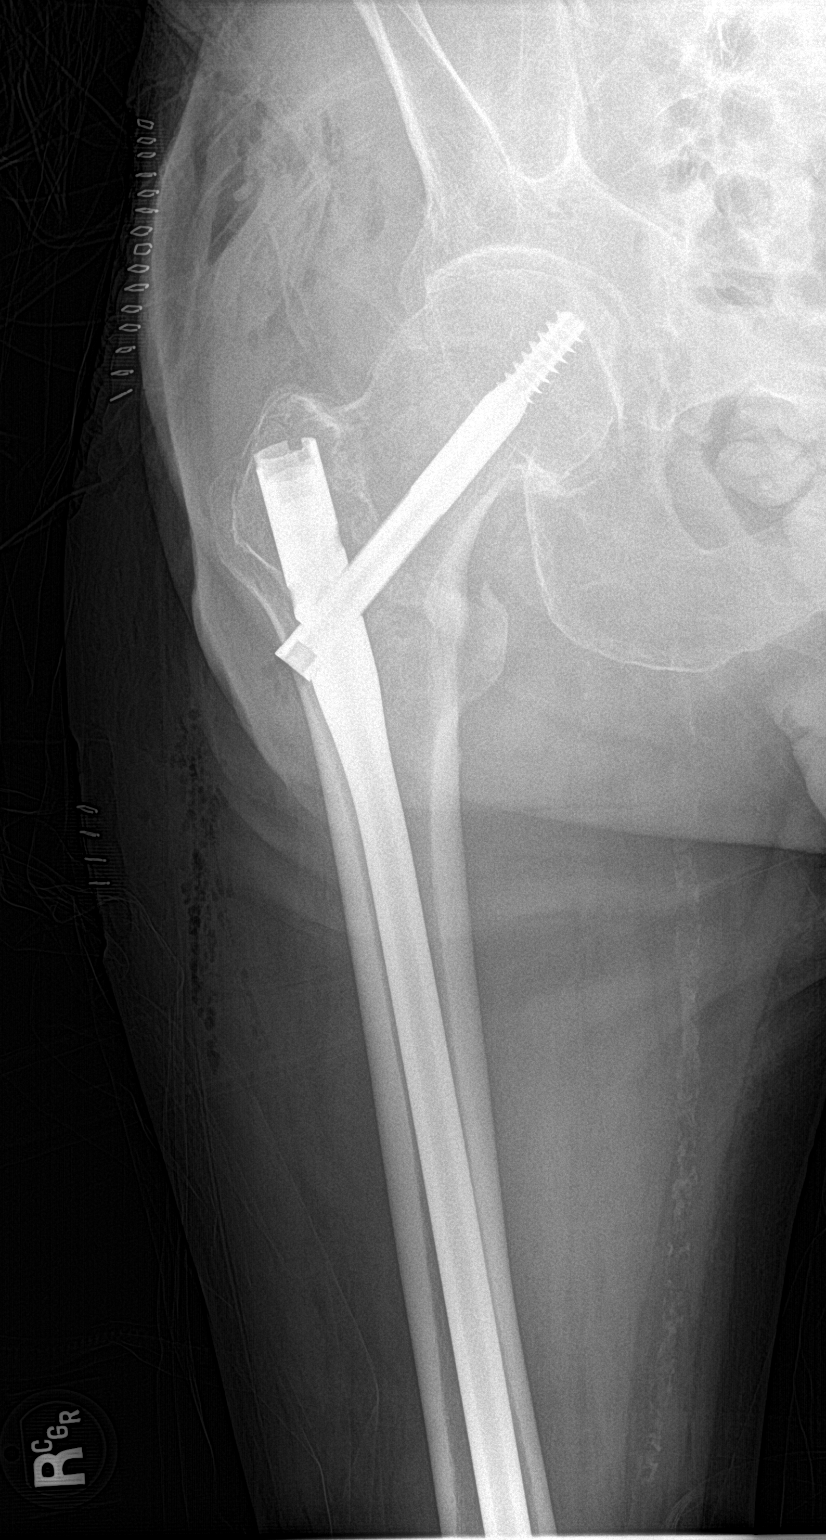

[femur ap (2 of 2)]
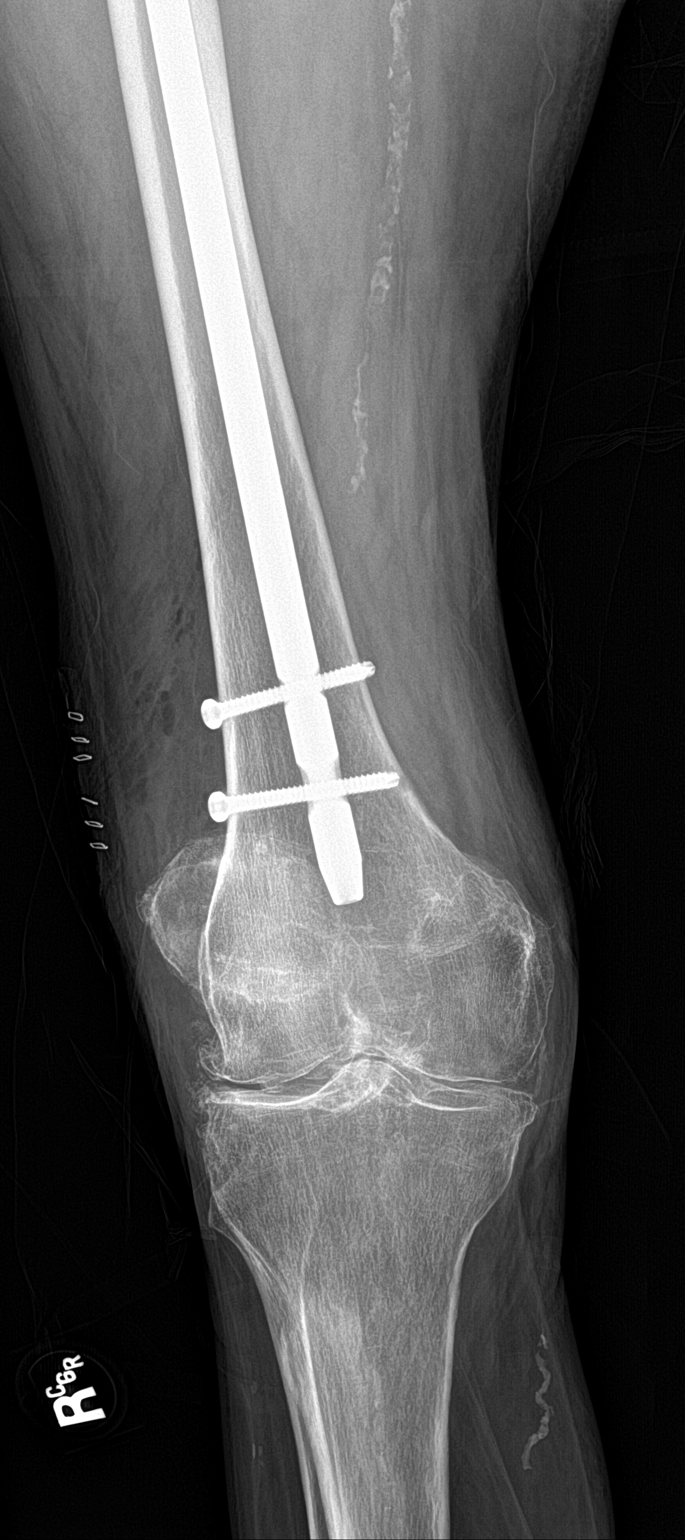

[femur lat (1 of 2)]
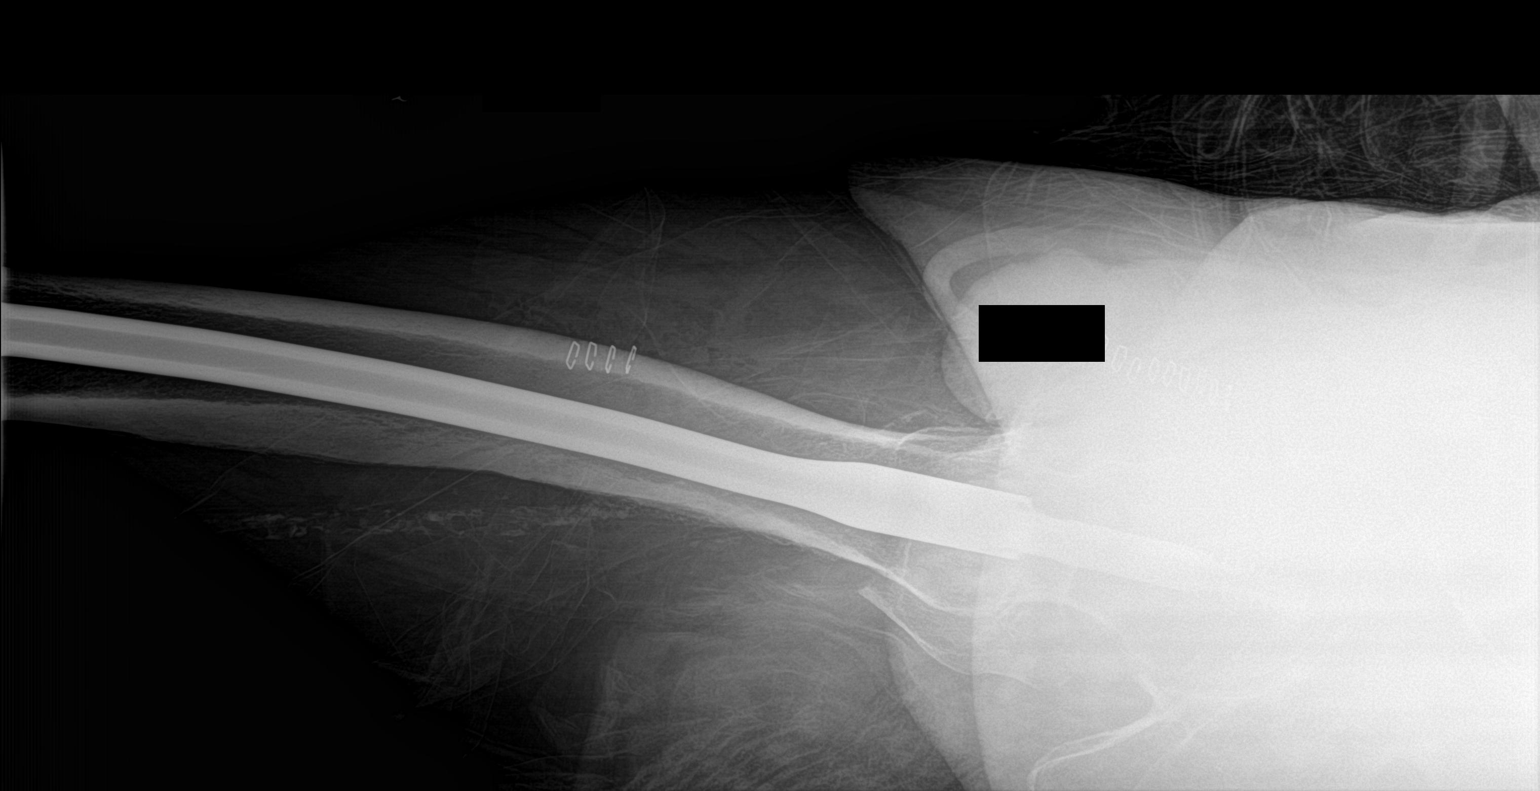

[femur lat (2 of 2)]
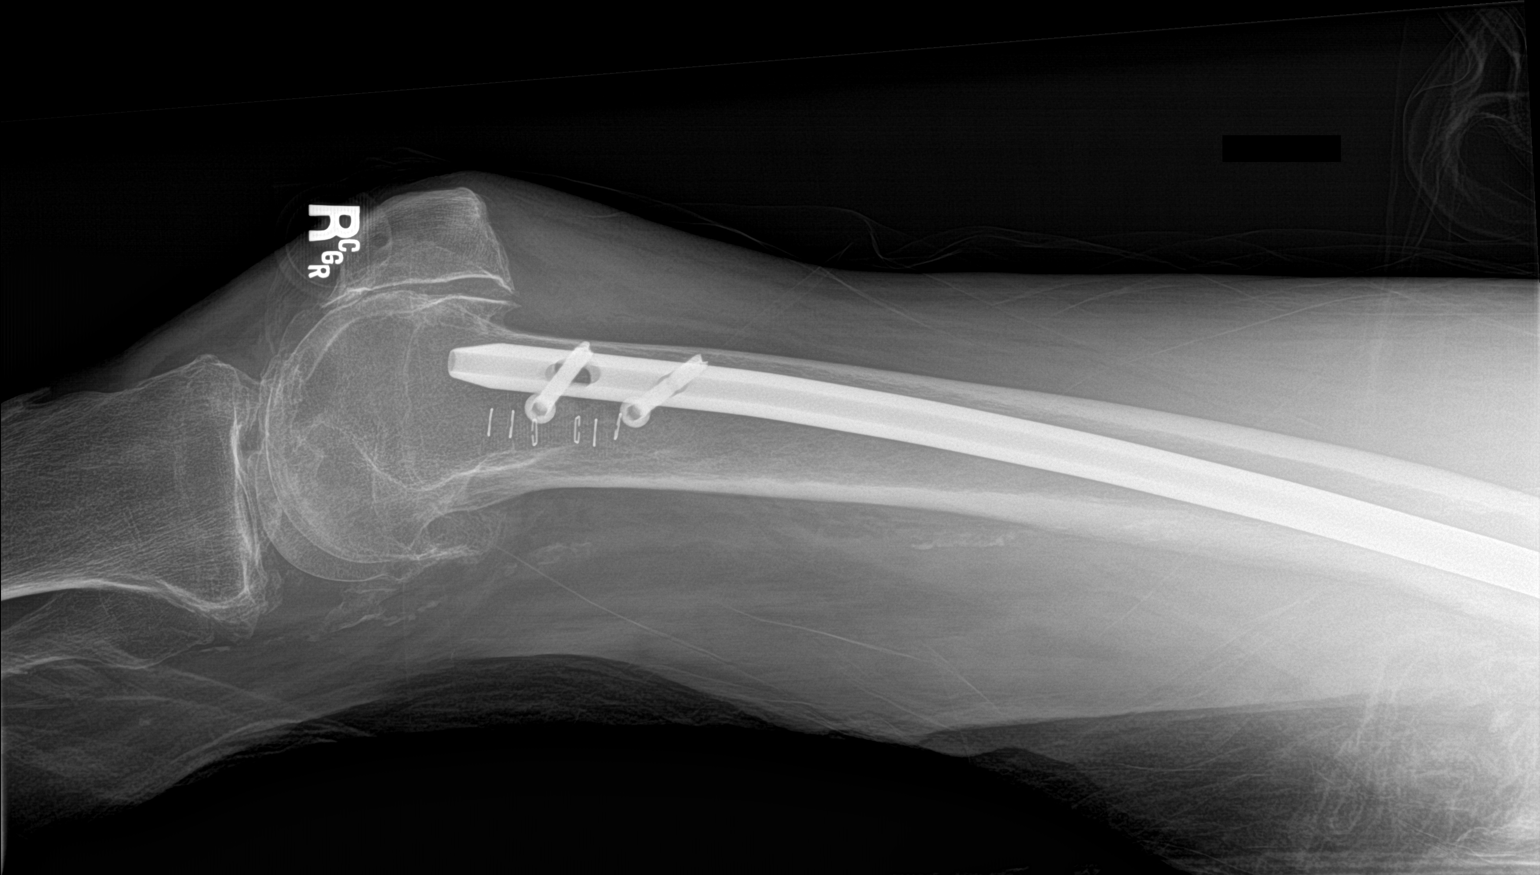

[4 of 4 positions shown; findings below may reference images not displayed]

FINDINGS: Status post surgical internal fixation of proximal right femoral
intertrochanteric fracture as well as intramedullary rod fixation of
the right femur. Good alignment of fracture components is noted.
Postsurgical changes are seen in the surrounding soft tissues.
IMPRESSION: Status post surgical internal fixation of proximal right femoral
intertrochanteric fracture with intramedullary rod fixation of the
right femur.
